# Patient Record
Sex: Female | Born: 1945 | Race: Black or African American | Hispanic: No | Marital: Single | Smoking: Former smoker
Health system: Southern US, Community
[De-identification: ages and names within clinical notes are randomized; demographics above are authoritative.]

## PROBLEM LIST (undated history)

## (undated) DIAGNOSIS — R569 Unspecified convulsions: Secondary | ICD-10-CM

## (undated) DIAGNOSIS — Z923 Personal history of irradiation: Secondary | ICD-10-CM

## (undated) DIAGNOSIS — C3490 Malignant neoplasm of unspecified part of unspecified bronchus or lung: Secondary | ICD-10-CM

## (undated) DIAGNOSIS — C719 Malignant neoplasm of brain, unspecified: Secondary | ICD-10-CM

## (undated) DIAGNOSIS — F17200 Nicotine dependence, unspecified, uncomplicated: Secondary | ICD-10-CM

## (undated) DIAGNOSIS — I10 Essential (primary) hypertension: Secondary | ICD-10-CM

## (undated) DIAGNOSIS — E785 Hyperlipidemia, unspecified: Secondary | ICD-10-CM

## (undated) HISTORY — DX: Nicotine dependence, unspecified, uncomplicated: F17.200

## (undated) HISTORY — DX: Hyperlipidemia, unspecified: E78.5

## (undated) HISTORY — DX: Essential (primary) hypertension: I10

---

## 1999-09-21 ENCOUNTER — Encounter: Admission: RE | Admit: 1999-09-21 | Discharge: 1999-09-21 | Payer: Self-pay | Admitting: *Deleted

## 1999-09-21 ENCOUNTER — Encounter: Payer: Self-pay | Admitting: *Deleted

## 1999-12-02 ENCOUNTER — Other Ambulatory Visit: Admission: RE | Admit: 1999-12-02 | Discharge: 1999-12-02 | Payer: Self-pay | Admitting: Obstetrics and Gynecology

## 2000-05-24 ENCOUNTER — Encounter: Payer: Self-pay | Admitting: *Deleted

## 2000-05-24 ENCOUNTER — Encounter: Admission: RE | Admit: 2000-05-24 | Discharge: 2000-05-24 | Payer: Self-pay | Admitting: *Deleted

## 2011-08-25 ENCOUNTER — Emergency Department (HOSPITAL_COMMUNITY)
Admission: EM | Admit: 2011-08-25 | Discharge: 2011-08-26 | Discharge status: Against Medical Advice | Payer: No Typology Code available for payment source | Attending: Emergency Medicine | Admitting: Emergency Medicine

## 2011-08-25 ENCOUNTER — Emergency Department (HOSPITAL_COMMUNITY): Payer: No Typology Code available for payment source

## 2011-08-25 DIAGNOSIS — M79609 Pain in unspecified limb: Secondary | ICD-10-CM | POA: Insufficient documentation

## 2011-08-26 ENCOUNTER — Emergency Department (HOSPITAL_COMMUNITY)
Admission: EM | Admit: 2011-08-26 | Discharge: 2011-08-26 | Disposition: A | Discharge status: Home / Self Care | Payer: No Typology Code available for payment source | Attending: Emergency Medicine | Admitting: Emergency Medicine

## 2011-08-26 DIAGNOSIS — M79609 Pain in unspecified limb: Secondary | ICD-10-CM | POA: Insufficient documentation

## 2011-08-26 DIAGNOSIS — I10 Essential (primary) hypertension: Secondary | ICD-10-CM | POA: Insufficient documentation

## 2011-08-26 DIAGNOSIS — S8010XA Contusion of unspecified lower leg, initial encounter: Secondary | ICD-10-CM | POA: Insufficient documentation

## 2013-12-14 ENCOUNTER — Encounter: Payer: Self-pay | Admitting: Family Medicine

## 2013-12-14 ENCOUNTER — Other Ambulatory Visit: Payer: Self-pay | Admitting: Family Medicine

## 2013-12-14 MED ORDER — BISOPROLOL-HYDROCHLOROTHIAZIDE 5-6.25 MG PO TABS: 1.0000 | ORAL_TABLET | Freq: Every day | ORAL | Status: DC

## 2013-12-14 NOTE — Telephone Encounter (Signed)
Rx Refilled and letter sent to schedule OV

## 2014-02-06 ENCOUNTER — Other Ambulatory Visit: Payer: Medicare HMO

## 2014-02-06 DIAGNOSIS — Z0000 Encounter for general adult medical examination without abnormal findings: Secondary | ICD-10-CM

## 2014-02-06 LAB — CBC WITH DIFFERENTIAL/PLATELET
Basophils Absolute: 0.1 10*3/uL (ref 0.0–0.1)
Basophils Relative: 1 % (ref 0–1)
Eosinophils Absolute: 0.1 10*3/uL (ref 0.0–0.7)
Eosinophils Relative: 1 % (ref 0–5)
HCT: 42.7 % (ref 36.0–46.0)
Hemoglobin: 14.5 g/dL (ref 12.0–15.0)
Lymphocytes Relative: 34 % (ref 12–46)
Lymphs Abs: 2.6 10*3/uL (ref 0.7–4.0)
MCH: 30.9 pg (ref 26.0–34.0)
MCHC: 34 g/dL (ref 30.0–36.0)
MCV: 91 fL (ref 78.0–100.0)
Monocytes Absolute: 0.7 10*3/uL (ref 0.1–1.0)
Monocytes Relative: 9 % (ref 3–12)
Neutro Abs: 4.1 10*3/uL (ref 1.7–7.7)
Neutrophils Relative %: 55 % (ref 43–77)
Platelets: 308 10*3/uL (ref 150–400)
RBC: 4.69 MIL/uL (ref 3.87–5.11)
RDW: 14.3 % (ref 11.5–15.5)
WBC: 7.5 10*3/uL (ref 4.0–10.5)

## 2014-02-06 LAB — LIPID PANEL
Cholesterol: 241 mg/dL — ABNORMAL HIGH (ref 0–200)
HDL: 48 mg/dL (ref >39)
LDL Cholesterol: 171 mg/dL — ABNORMAL HIGH (ref 0–99)
Total CHOL/HDL Ratio: 5 Ratio
Triglycerides: 109 mg/dL (ref <150)
VLDL: 22 mg/dL (ref 0–40)

## 2014-02-06 LAB — COMPLETE METABOLIC PANEL WITH GFR
ALT: 14 U/L (ref 0–35)
AST: 12 U/L (ref 0–37)
Albumin: 4 g/dL (ref 3.5–5.2)
Alkaline Phosphatase: 62 U/L (ref 39–117)
BUN: 14 mg/dL (ref 6–23)
CO2: 25 mEq/L (ref 19–32)
Calcium: 9.2 mg/dL (ref 8.4–10.5)
Chloride: 101 mEq/L (ref 96–112)
Creat: 0.79 mg/dL (ref 0.50–1.10)
GFR, Est African American: >89 mL/min
GFR, Est Non African American: 79 mL/min
Glucose, Bld: 94 mg/dL (ref 70–99)
Potassium: 4.8 mEq/L (ref 3.5–5.3)
Sodium: 136 mEq/L (ref 135–145)
Total Bilirubin: 1 mg/dL (ref 0.2–1.2)
Total Protein: 6.6 g/dL (ref 6.0–8.3)

## 2014-02-12 ENCOUNTER — Encounter: Payer: Self-pay | Admitting: Family Medicine

## 2014-02-12 ENCOUNTER — Other Ambulatory Visit: Payer: Self-pay | Admitting: Family Medicine

## 2014-02-12 ENCOUNTER — Ambulatory Visit (INDEPENDENT_AMBULATORY_CARE_PROVIDER_SITE_OTHER): Payer: Medicare HMO | Admitting: Family Medicine

## 2014-02-12 VITALS — BP 136/80 | HR 78 | Temp 97.3°F | Resp 16 | Ht 63.0 in | Wt 178.0 lb

## 2014-02-12 DIAGNOSIS — F17200 Nicotine dependence, unspecified, uncomplicated: Secondary | ICD-10-CM | POA: Insufficient documentation

## 2014-02-12 DIAGNOSIS — Z0000 Encounter for general adult medical examination without abnormal findings: Secondary | ICD-10-CM

## 2014-02-12 NOTE — Progress Notes (Signed)
Subjective:    Patient ID: Kristina Lawson, female    DOB: 10/13/1946, 68 y.o.   MRN: [id number]  HPI Patient is a very pleasant 68 year old African American female who continues to smoke, she has high blood pressure, and she has uncontrolled hyperlipidemia. She refuses to take any statin medication. She is interested in strategies to help with smoking cessation. She is overdue for mammogram. She is overdue for colonoscopy. She is due for Pneumovax 23 as well as Prevnar 13. She refuses on Monday vaccines. She refuses a colonoscopy. Past Medical History  Diagnosis Date  . Hypertension   . Smoker   . Hyperlipidemia    Current Outpatient Prescriptions on File Prior to Visit  Medication Sig Dispense Refill  . bisoprolol-hydrochlorothiazide (ZIAC) 5-6.25 MG per tablet Take 1 tablet by mouth daily.  30 tablet  1   No current facility-administered medications on file prior to visit.   No Known Allergies History   Social History  . Marital Status: Single    Spouse Name: N/A    Number of Children: N/A  . Years of Education: N/A   Occupational History  . Not on file.   Social History Main Topics  . Smoking status: Current Every Day Smoker  . Smokeless tobacco: Not on file  . Alcohol Use: Yes     Comment: Occasional Wine  . Drug Use: No  . Sexual Activity: Not on file   Other Topics Concern  . Not on file   Social History Narrative  . No narrative on file   Family History  Problem Relation Age of Onset  . Heart disease Mother       Review of Systems  All other systems reviewed and are negative.       Objective:   Physical Exam  Vitals reviewed. Constitutional: She is oriented to person, place, and time. She appears well-developed and well-nourished. No distress.  HENT:  Head: Normocephalic and atraumatic.  Right Ear: External ear normal.  Left Ear: External ear normal.  Nose: Nose normal.  Mouth/Throat: Oropharynx is clear and moist. No oropharyngeal exudate.    Eyes: Conjunctivae and EOM are normal. Pupils are equal, round, and reactive to light. Right eye exhibits no discharge. Left eye exhibits no discharge. No scleral icterus.  Neck: Normal range of motion. Neck supple. No JVD present. No tracheal deviation present. No thyromegaly present.  Cardiovascular: Normal rate, regular rhythm, normal heart sounds and intact distal pulses.  Exam reveals no gallop and no friction rub.   No murmur heard. Pulmonary/Chest: Effort normal and breath sounds normal. No stridor. No respiratory distress. She has no wheezes. She has no rales. She exhibits no tenderness.  Abdominal: Soft. Bowel sounds are normal. She exhibits no distension and no mass. There is no tenderness. There is no rebound and no guarding.  Genitourinary: Vagina normal and uterus normal. No vaginal discharge found.  Musculoskeletal: Normal range of motion. She exhibits no edema and no tenderness.  Lymphadenopathy:    She has no cervical adenopathy.  Neurological: She is alert and oriented to person, place, and time. She has normal reflexes. She displays normal reflexes. No cranial nerve deficit. She exhibits normal muscle tone. Coordination normal.  Skin: Skin is warm. No rash noted. She is not diaphoretic. No erythema. No pallor.  Psychiatric: She has a normal mood and affect. Her behavior is normal. Judgment and thought content normal.   patient has a mild bladder prolapse.        Assessment &  Plan:  1. Routine general medical examination at a health care facility Pap smear was performed today in the office. Her blood pressure is well controlled. I recommended Lipitor for her hyperlipidemia but she declined. I recommended a colonoscopy but she declined it. We will perform fecal occult blood cards instead. I will schedule the patient for a mammogram. Also the patient Prevnar and Pneumovax but she declined. - PAP, ThinPrep, Imaging, Medicare - Fecal occult blood, imunochemical - Fecal occult  blood, imunochemical - Fecal occult blood, imunochemical - MM Digital Diagnostic Bilat; Future

## 2014-02-13 ENCOUNTER — Encounter: Payer: Self-pay | Admitting: *Deleted

## 2014-02-13 ENCOUNTER — Telehealth: Payer: Self-pay | Admitting: Family Medicine

## 2014-02-13 MED ORDER — BISOPROLOL-HYDROCHLOROTHIAZIDE 5-6.25 MG PO TABS: 1.0000 | ORAL_TABLET | Freq: Every day | ORAL | Status: DC

## 2014-02-13 NOTE — Telephone Encounter (Signed)
Call back number is [phone] Pt is calling because she is needing a refill on bisoprolol-hydrochlorothiazide (ZIAC) 5-6.25 MG per tablet She forgot to tell you yesterday

## 2014-02-13 NOTE — Telephone Encounter (Signed)
Rx Refilled  

## 2014-02-14 LAB — PAP, THIN PREP, IMAGING, MEDICARE

## 2014-02-15 ENCOUNTER — Other Ambulatory Visit: Payer: Self-pay | Admitting: Family Medicine

## 2014-02-15 DIAGNOSIS — Z1231 Encounter for screening mammogram for malignant neoplasm of breast: Secondary | ICD-10-CM

## 2014-02-19 ENCOUNTER — Other Ambulatory Visit: Payer: Self-pay | Admitting: Family Medicine

## 2014-02-19 DIAGNOSIS — E559 Vitamin D deficiency, unspecified: Secondary | ICD-10-CM

## 2014-02-19 DIAGNOSIS — Z1231 Encounter for screening mammogram for malignant neoplasm of breast: Secondary | ICD-10-CM

## 2014-02-19 LAB — HPV DNA, RELFEX TYPE 16/18: HPV DNA High Risk: NOT DETECTED

## 2014-06-17 ENCOUNTER — Emergency Department (HOSPITAL_COMMUNITY): Payer: Medicare HMO

## 2014-06-17 ENCOUNTER — Inpatient Hospital Stay (HOSPITAL_COMMUNITY)
Admission: EM | Admit: 2014-06-17 | Discharge: 2014-06-19 | DRG: 054 | Disposition: A | Discharge status: Home / Self Care | Payer: Medicare HMO | Attending: Internal Medicine | Admitting: Internal Medicine

## 2014-06-17 ENCOUNTER — Encounter (HOSPITAL_COMMUNITY): Payer: Self-pay | Admitting: Radiology

## 2014-06-17 DIAGNOSIS — R569 Unspecified convulsions: Secondary | ICD-10-CM

## 2014-06-17 DIAGNOSIS — R918 Other nonspecific abnormal finding of lung field: Secondary | ICD-10-CM

## 2014-06-17 DIAGNOSIS — E785 Hyperlipidemia, unspecified: Secondary | ICD-10-CM | POA: Diagnosis present

## 2014-06-17 DIAGNOSIS — E279 Disorder of adrenal gland, unspecified: Secondary | ICD-10-CM | POA: Diagnosis present

## 2014-06-17 DIAGNOSIS — F17200 Nicotine dependence, unspecified, uncomplicated: Secondary | ICD-10-CM | POA: Diagnosis present

## 2014-06-17 DIAGNOSIS — Z8249 Family history of ischemic heart disease and other diseases of the circulatory system: Secondary | ICD-10-CM

## 2014-06-17 DIAGNOSIS — C7931 Secondary malignant neoplasm of brain: Principal | ICD-10-CM | POA: Diagnosis present

## 2014-06-17 DIAGNOSIS — I10 Essential (primary) hypertension: Secondary | ICD-10-CM | POA: Diagnosis present

## 2014-06-17 DIAGNOSIS — G936 Cerebral edema: Secondary | ICD-10-CM | POA: Diagnosis present

## 2014-06-17 DIAGNOSIS — C7949 Secondary malignant neoplasm of other parts of nervous system: Principal | ICD-10-CM

## 2014-06-17 DIAGNOSIS — Z803 Family history of malignant neoplasm of breast: Secondary | ICD-10-CM

## 2014-06-17 DIAGNOSIS — E871 Hypo-osmolality and hyponatremia: Secondary | ICD-10-CM | POA: Diagnosis present

## 2014-06-17 DIAGNOSIS — IMO0002 Reserved for concepts with insufficient information to code with codable children: Secondary | ICD-10-CM | POA: Diagnosis not present

## 2014-06-17 DIAGNOSIS — R222 Localized swelling, mass and lump, trunk: Secondary | ICD-10-CM | POA: Diagnosis present

## 2014-06-17 DIAGNOSIS — C719 Malignant neoplasm of brain, unspecified: Secondary | ICD-10-CM

## 2014-06-17 HISTORY — DX: Unspecified convulsions: R56.9

## 2014-06-17 HISTORY — DX: Malignant neoplasm of brain, unspecified: C71.9

## 2014-06-17 LAB — CBC
HEMATOCRIT: 42.3 % (ref 36.0–46.0)
HEMOGLOBIN: 14.2 g/dL (ref 12.0–15.0)
MCH: 30.3 pg (ref 26.0–34.0)
MCHC: 33.6 g/dL (ref 30.0–36.0)
MCV: 90.4 fL (ref 78.0–100.0)
Platelets: 299 10*3/uL (ref 150–400)
RBC: 4.68 MIL/uL (ref 3.87–5.11)
RDW: 13.5 % (ref 11.5–15.5)
WBC: 11.1 10*3/uL — AB (ref 4.0–10.5)

## 2014-06-17 LAB — BASIC METABOLIC PANEL
Anion gap: 17 — ABNORMAL HIGH (ref 5–15)
BUN: 9 mg/dL (ref 6–23)
CHLORIDE: 86 meq/L — AB (ref 96–112)
CO2: 23 meq/L (ref 19–32)
Calcium: 9.5 mg/dL (ref 8.4–10.5)
Creatinine, Ser: 0.74 mg/dL (ref 0.50–1.10)
GFR calc Af Amer: >90 mL/min (ref >90)
GFR calc non Af Amer: 87 mL/min — ABNORMAL LOW (ref >90)
Glucose, Bld: 145 mg/dL — ABNORMAL HIGH (ref 70–99)
Potassium: 4.3 mEq/L (ref 3.7–5.3)
SODIUM: 126 meq/L — AB (ref 137–147)

## 2014-06-17 LAB — CBG MONITORING, ED: Glucose-Capillary: 133 mg/dL — ABNORMAL HIGH (ref 70–99)

## 2014-06-17 MED ORDER — DEXAMETHASONE SODIUM PHOSPHATE 10 MG/ML IJ SOLN
10.0000 mg | Freq: Once | INTRAMUSCULAR | Status: AC
  Administered 2014-06-17: 10 mg via INTRAVENOUS
  Filled 2014-06-17: qty 1

## 2014-06-17 MED ORDER — GADOBENATE DIMEGLUMINE 529 MG/ML IV SOLN: 17.0000 mL | Freq: Once | INTRAVENOUS | Status: AC | PRN

## 2014-06-17 MED ORDER — GADOBENATE DIMEGLUMINE 529 MG/ML IV SOLN
17.0000 mL | Freq: Once | INTRAVENOUS | Status: AC | PRN
  Administered 2014-06-17: 17 mL via INTRAVENOUS

## 2014-06-17 MED ORDER — SODIUM CHLORIDE 0.9 % IV BOLUS (SEPSIS)
1000.0000 mL | Freq: Once | INTRAVENOUS | Status: AC
  Administered 2014-06-17: 1000 mL via INTRAVENOUS

## 2014-06-17 MED ORDER — LEVETIRACETAM IN NACL 1000 MG/100ML IV SOLN
1000.0000 mg | Freq: Once | INTRAVENOUS | Status: AC
  Administered 2014-06-17: 1000 mg via INTRAVENOUS
  Filled 2014-06-17: qty 100

## 2014-06-17 NOTE — ED Provider Notes (Signed)
CSN: [id number]     Arrival date & time 06/17/14  1824 History   First MD Initiated Contact with Patient 06/17/14 1854     Chief Complaint  Patient presents with  . Seizures     (Consider location/radiation/quality/duration/timing/severity/associated sxs/prior Treatment) HPI Pt presents after report of seizure activity.  She was sitting in a chair and had onset of stuttering speech, tonic/clonic movements of her right arm.  She was not answering questions.  No syncope or loss of consciosness.  To me patient denies remembering what occurred.  Per EMS she was groggy upon their arrival but quickly returned to her baseline.  No incontinence of urine.  No recent illness. No headaches.  She mentions having right shoulder pain for some time, but no weakness of arms.  No prior hx  Past Medical History  Diagnosis Date  . Hypertension   . Smoker   . Hyperlipidemia    History reviewed. No pertinent past surgical history. Family History  Problem Relation Age of Onset  . Heart disease Mother    History  Substance Use Topics  . Smoking status: Current Every Day Smoker  . Smokeless tobacco: Not on file  . Alcohol Use: Yes     Comment: Occasional Wine   OB History   Grav Para Term Preterm Abortions TAB SAB Ect Mult Living                 Review of Systems ROS reviewed and all otherwise negative except for mentioned in HPI    Allergies  Review of patient's allergies indicates no active allergies.  Home Medications   Prior to Admission medications   Medication Sig Start Date End Date Taking? Authorizing Provider  bisoprolol-hydrochlorothiazide (ZIAC) 5-6.25 MG per tablet Take 1 tablet by mouth daily.   Yes Historical Provider, MD  Cholecalciferol (VITAMIN D) 2000 UNITS tablet Take 2,000 Units by mouth daily.   Yes Historical Provider, MD  Omega-3 Fatty Acids (FISH OIL) 1200 MG CAPS Take 1,200 mg by mouth daily.    Yes Historical Provider, MD  penicillin v potassium (VEETID) 500 MG  tablet Take 500 mg by mouth 4 (four) times daily.   Yes Historical Provider, MD  bisoprolol (ZEBETA) 5 MG tablet Take 1 tablet (5 mg total) by mouth daily. 06/19/14   Domenic Polite, MD  dexamethasone (DECADRON) 4 MG tablet Take 1 tablet (4 mg total) by mouth 2 (two) times daily with a meal. 06/19/14   Domenic Polite, MD  levETIRAcetam (KEPPRA) 500 MG tablet Take 1 tablet (500 mg total) by mouth 2 (two) times daily. 06/19/14   Domenic Polite, MD   BP 151/78  Pulse 83  Temp(Src) 98.1 F (36.7 C) (Oral)  Resp 14  Ht 5\' 3"  (1.6 m)  Wt 173 lb 8 oz (78.7 kg)  BMI 30.74 kg/m2  SpO2 100% Vitals reviewed Physical Exam Physical Examination: General appearance - alert, well appearing, and in no distress Mental status - alert, oriented to person, place, and time Eyes - pupils equal and reactive, extraocular movements intact Mouth - mucous membranes moist, pharynx normal without lesions Neck - supple, no significant adenopathy Chest - clear to auscultation, no wheezes, rales or rhonchi, symmetric air entry Heart - normal rate, regular rhythm, normal S1, S2, no murmurs, rubs, clicks or gallops Abdomen - soft, nontender, nondistended, no masses or organomegaly Neurological - alert, oriented x 3, cranial nerves 2-12 tested and intact, strength 5/5 in extremities x 4, sensation intact Extremities - peripheral pulses normal, no  pedal edema, no clubbing or cyanosis Skin - normal coloration and turgor, no rashes  ED Course  Procedures (including critical care time)  10:41 PM neurology has seen patient, MR shows brain mets- he has written for decadron, keppra.  I have discussed with dr. Hal Hope for admission to step down bed.   Labs Review Labs Reviewed  BASIC METABOLIC PANEL - Abnormal; Notable for the following:    Sodium 126 (*)    Chloride 86 (*)    Glucose, Bld 145 (*)    GFR calc non Af Amer 87 (*)    Anion gap 17 (*)    All other components within normal limits  CBC - Abnormal; Notable  for the following:    WBC 11.1 (*)    All other components within normal limits  COMPREHENSIVE METABOLIC PANEL - Abnormal; Notable for the following:    Sodium 131 (*)    Chloride 95 (*)    Glucose, Bld 151 (*)    All other components within normal limits  CBC WITH DIFFERENTIAL - Abnormal; Notable for the following:    Neutrophils Relative % 91 (*)    Lymphocytes Relative 7 (*)    Lymphs Abs 0.6 (*)    Monocytes Relative 2 (*)    All other components within normal limits  BASIC METABOLIC PANEL - Abnormal; Notable for the following:    Sodium 136 (*)    Glucose, Bld 112 (*)    GFR calc non Af Amer 88 (*)    All other components within normal limits  CBG MONITORING, ED - Abnormal; Notable for the following:    Glucose-Capillary 133 (*)    All other components within normal limits  MRSA PCR SCREENING  TSH  PROTIME-INR  APTT  CBC  CBC  SURGICAL PATHOLOGY    Imaging Review Dg Chest 2 View  06/17/2014   CLINICAL DATA:  Seizure.  EXAM: CHEST  2 VIEW  COMPARISON:  None  FINDINGS: The lungs are well-aerated. Focal right basilar airspace opacity is compatible with pneumonia, with additional right perihilar opacity seen. Mild vascular congestion is noted. No pleural effusion or pneumothorax is identified.  The heart is mildly enlarged. No acute osseous abnormalities are seen.  IMPRESSION: 1. Focal right basilar and right perihilar airspace opacity is compatible with pneumonia. Would treat for pneumonia, then perform follow-up chest radiograph to ensure resolution of airspace opacity. If the patient has no symptoms for pneumonia, would correlate to exclude aspiration. Mass cannot be excluded, but is considered less likely given the appearance of the opacity. 2. Mild vascular congestion and mild cardiomegaly noted.   Electronically Signed   By: Garald Balding M.D.   On: 06/17/2014 22:13   Ct Head Wo Contrast   (if New Onset Seizure And/or Head Trauma)  06/17/2014   CLINICAL DATA:  Seizures   EXAM: CT HEAD WITHOUT CONTRAST  TECHNIQUE: Contiguous axial images were obtained from the base of the skull through the vertex without intravenous contrast.  COMPARISON:  None.  FINDINGS: Abnormal white matter hypodensity in the right occipital lobe and right posterior parietal lobe is asymmetric. Although the possibility of some cortical involvement is raised, this primarily appears to involve the white matter and is accordingly suspicious for vasogenic edema. Right upper parietal gyri appear asymmetric on image 25 of series 2.  The brainstem, cerebellum, basal ganglia, ventricular system, and basilar cisterns appear otherwise unremarkable. There is atherosclerotic calcification of the cavernous carotid arteries bilaterally.  IMPRESSION: 1. Asymmetric abnormal white matter  hypodensity in the right occipital lobe and right parietal lobe posteriorly. This primarily resembles vasogenic edema and raises the possibility of underlying tumor, encephalitis, demyelinating process, or less likely an unusual ischemic process. Posterior reversible encephalopathy is considered less likely given the unilaterality. MRI brain with and without contrast is recommended. Also consider obtaining a chest radiograph.  These results were called by telephone at the time of interpretation on 06/17/2014 at 8:12 pm to Dr. Lajean Saver, who verbally acknowledged these results.   Electronically Signed   By: Sherryl Barters M.D.   On: 06/17/2014 20:13   Ct Chest W Contrast  06/18/2014   CLINICAL DATA:  Known intracranial metastatic disease  EXAM: CT CHEST, ABDOMEN, AND PELVIS WITH CONTRAST  TECHNIQUE: Multidetector CT imaging of the chest, abdomen and pelvis was performed following the standard protocol during bolus administration of intravenous contrast.  CONTRAST:  131mL OMNIPAQUE IOHEXOL 300 MG/ML  SOLN  COMPARISON:  Chest x-ray from the previous day  FINDINGS: CT CHEST FINDINGS  The left lung is well aerated. A 9 mm nodule is noted within  the left lateral costophrenic angle. The right lung is well aerated but demonstrates evidence of a the lobular mass lesion which measures at least 2.6 x 2.3 cm within the right upper lobe posteriorly. Some associated atelectatic changes are noted. Additionally there is significant sub carinal and right hilar and right paratracheal lymphadenopathy. The right paratracheal adenopathy measures 2.8 x 2.3 cm. The subcarinal and right hilar adenopathy is confluent and measures at least 7.0 by 5.6 cm. It encases the superior vena cava and right pulmonary artery and causes some stenosis but no obstruction is seen. No thrombus is noted. Coronary calcifications are seen.  CT ABDOMEN AND PELVIS FINDINGS  The liver, gallbladder, spleen and right adrenal gland are within normal limits. The pancreas is unremarkable. The left adrenal gland demonstrates a 3.4 x 3.2 cm mass lesion consistent with metastatic disease. Kidneys are well visualized and demonstrate evidence of portion to deformity with an apparent central fibrous band. No renal calculi or obstructive changes are noted. Renal vascular calcifications are seen. Diffuse aortic calcifications are noted without aneurysmal dilatation.  The appendix is within normal limits. Mild diverticular change is noted without evidence of diverticulitis. The bladder is well distended. The uterus show some scattered calcifications likely related uterine fibroid disease. No definitive bony metastatic disease is noted.  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 Vascular encasement without occlusion or thrombosis is noted.  These results will be called to the ordering clinician or representative by the Radiologist Assistant, and communication documented in the PACS or zVision Dashboard.   Electronically  Signed   By: Inez Catalina M.D.   On: 06/18/2014 13:54   Mr Jeri Cos LF Contrast  06/17/2014   CLINICAL DATA:  Seizure.  Abnormal head CT.  EXAM: MRI HEAD WITHOUT AND WITH CONTRAST  TECHNIQUE: Multiplanar, multiecho pulse sequences of the brain and surrounding structures were obtained without and with intravenous contrast.  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  There is a 14 mm avidly enhancing lesion in the right parieto-occipital region without central necrosis. There is pronounced regional vasogenic edema. There is  a second lesion within the cerebellum on the right measuring 4 mm in diameter. There is a third lesion within the inferior cerebellum on the left measuring 5 mm in diameter. There is a 4 mm lesion in the right frontal region. There is a questionable fifth lesion deep to the insula on the left measuring about 4 mm.  No evidence of hydrocephalus.  No extra-axial fluid collection.  There is an extensive lesion centered within the sphenoid bone on the left with enhancing mass extending along the lateral margin and floor of the middle cranial fossa and into the masticator space. This is presumed to represent an additional site of metastatic disease. Theoretically, this could be a primary lesion from which the brain metastases arose.  IMPRESSION: 4 and possibly 5 brain masses consistent with metastatic disease. The largest lesion was visible at CT. This represents a 14 mm mass in the right parietal occipital brain with surrounding edema. There are 2 lesions an the cerebellum and a few other scattered within the cerebral hemispheres.  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 This could represent metastatic disease or  could be a primary lesion of some sort.   Electronically Signed   By: Nelson Chimes M.D.   On: 06/17/2014 21:47   Ct Abdomen Pelvis W Contrast  06/18/2014   CLINICAL DATA:  Known intracranial metastatic disease  EXAM: CT CHEST, ABDOMEN, AND PELVIS WITH CONTRAST  TECHNIQUE: Multidetector CT imaging of the chest, abdomen and pelvis was performed following the standard protocol during bolus administration of intravenous contrast.  CONTRAST:  170mL OMNIPAQUE IOHEXOL 300 MG/ML  SOLN  COMPARISON:  Chest x-ray from the previous day  FINDINGS: CT CHEST FINDINGS  The left lung is well aerated. A 9 mm nodule is noted within the left lateral costophrenic angle. The right lung is well aerated but demonstrates evidence of a the lobular mass lesion which measures at least 2.6 x 2.3 cm within the right upper lobe posteriorly. Some associated atelectatic changes are noted. Additionally there is significant sub carinal and right hilar and right paratracheal lymphadenopathy. The right paratracheal adenopathy measures 2.8 x 2.3 cm. The subcarinal and right hilar adenopathy is confluent and measures at least 7.0 by 5.6 cm. It encases the superior vena cava and right pulmonary artery and causes some stenosis but no obstruction is seen. No thrombus is noted. Coronary calcifications are seen.  CT ABDOMEN AND PELVIS FINDINGS  The liver, gallbladder, spleen and right adrenal gland are within normal limits. The pancreas is unremarkable. The left adrenal gland demonstrates a 3.4 x 3.2 cm mass lesion consistent with metastatic disease. Kidneys are well visualized and demonstrate evidence of portion to deformity with an apparent central fibrous band. No renal calculi or obstructive changes are noted. Renal vascular calcifications are seen. Diffuse aortic calcifications are noted without aneurysmal dilatation.  The appendix is within normal limits. Mild diverticular change is noted without evidence of diverticulitis. The bladder is well  distended. The uterus show some scattered calcifications likely related uterine fibroid disease. No definitive bony metastatic disease is noted.  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 Vascular encasement without occlusion or thrombosis is noted.  These results  will be called to the ordering clinician or representative by the Radiologist Assistant, and communication documented in the PACS or zVision Dashboard.   Electronically Signed   By: Inez Catalina M.D.   On: 06/18/2014 13:54   Dg Humerus Right  06/18/2014   CLINICAL DATA:  Right arm pain without history of trauma  EXAM: RIGHT HUMERUS - 2+ VIEW  COMPARISON:  None.  FINDINGS: The humerus is adequately mineralized. No acute fracture or dislocation is demonstrated. Portions of the upper aspect of the shaft are obscured 2 of the images. The observed portions of the glenohumeral joint and of the elbow exhibit no acute abnormalities. The soft tissues of the arm are unremarkable.  IMPRESSION: There is no acute bony abnormality of the right humerus.   Electronically Signed   By: David  Martinique   On: 06/18/2014 18:20     EKG Interpretation   Date/Time:  Monday June 17 2014 18:36:41 EDT Ventricular Rate:  106 PR Interval:  181 QRS Duration: 77 QT Interval:  339 QTC Calculation: 450 R Axis:   24 Text Interpretation:  Sinus tachycardia Ventricular premature complex  Probable left atrial enlargement Low voltage, precordial leads Borderline  repolarization abnormality No old tracing to compare Confirmed by [facility]   MD, Zayyan Mullen [phone]) on 06/17/2014 10:12:39 PM      MDM   Final diagnoses:  Generalized seizure  Brain metastasis  Smoker  Brain metastases  Essential hypertension  Seizures  Lung mass    Pt presenting after new onset seizure- found to  have vasogenic edema on CT scan.  Neurology consulted, MRI shows multiple mets of brain.  CXR with findings concerning for lung mass versus pneumonia- more likely mass- no signs or symptoms of pneumonia.  Neurology recommended decadron and keppra.  Pt has normal neuro exam in the ED.  Pt admitted to [facility] for further workup and management.      Threasa Beards, MD 06/19/14 [phone]

## 2014-06-17 NOTE — Consult Note (Signed)
Reason for Consult: Seizure with abnormal brain scan.  HPI:                                                                                                                                          Kristina Lawson is an 68 y.o. female 68 year old lady with history of hypertension and hyperlipidemia as well as tobacco abuse, brought to the emergency room following a witnessed generalized seizure. Patient has no previous history of seizure activity. CT scan of her head showed an area of hypodensity in the right parieto-occipital region indicative of vasogenic edema. MRI of the brain showed 4 and possibly 5 brain masses consistent with metastatic disease. The largest lesion was in the right parieto-occipital region measuring 14 mm with surrounding edema. 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 Patient's only complaint is feeling of swelling involving left side of her face and right shoulder pain.  Past Medical History  Diagnosis Date  . Hypertension   . Smoker   . Hyperlipidemia     No past surgical history on file.  Family History  Problem Relation Age of Onset  . Heart disease Mother     Social History:  reports that she has been smoking.  She does not have any smokeless tobacco history on file. She reports that she drinks alcohol. She reports that she does not use illicit drugs.  No Active Allergies  MEDICATIONS:                                                                                                                     I have reviewed the patient's current medications.   ROS:  History obtained from the patient  General ROS: negative for - chills, fatigue, fever, night sweats,  weight gain or weight loss Psychological ROS: negative for - behavioral disorder, hallucinations, memory difficulties, mood swings or suicidal ideation Ophthalmic ROS: negative for - blurry vision, double vision, eye pain or loss of vision ENT ROS: negative for - epistaxis, nasal discharge, oral lesions, sore throat, tinnitus or vertigo Allergy and Immunology ROS: negative for - hives or itchy/watery eyes Hematological and Lymphatic ROS: negative for - bleeding problems, bruising or swollen lymph nodes Endocrine ROS: negative for - galactorrhea, hair pattern changes, polydipsia/polyuria or temperature intolerance Respiratory ROS: negative for - cough, hemoptysis, shortness of breath or wheezing Cardiovascular ROS: negative for - chest pain, dyspnea on exertion, edema or irregular heartbeat Gastrointestinal ROS: negative for - abdominal pain, diarrhea, hematemesis, nausea/vomiting or stool incontinence Genito-Urinary ROS: negative for - dysuria, hematuria, incontinence or urinary frequency/urgency Musculoskeletal ROS: Positive for right shoulder pain over the past one week Neurological ROS: as noted in HPI Dermatological ROS: negative for rash and skin lesion changes   Blood pressure 193/80, pulse 86, temperature 97.4 F (36.3 C), temperature source Oral, resp. rate 17, SpO2 92.00%.   Neurologic Examination:                                                                                                      Mental Status: Alert, oriented, thought content appropriate.  Speech fluent without evidence of aphasia. Able to follow commands without difficulty. Cranial Nerves: II-left visual field defect with inconsistency in ability to count fingers. III/IV/VI-Pupils were equal and reacted. Extraocular movements were full and conjugate.    V/VII-no facial numbness and no facial weakness. VIII-normal. X-normal speech and symmetrical palatal movement. Motor: 5/5 bilaterally with normal tone and  bulk Sensory: Normal throughout. Deep Tendon Reflexes: 1+ and symmetric in upper extremities and absent at knees and ankles. Plantars: Flexor bilaterally Cerebellar: Normal finger-to-nose testing was normal with use of left upper extremity; could not be tested on the right due to limited range of motion of right shoulder secondary to pain.  Lab Results  Component Value Date/Time   CHOL 241* 02/06/2014  9:08 AM    Results for orders placed during the hospital encounter of 06/17/14 (from the past 48 hour(s))  BASIC METABOLIC PANEL     Status: Abnormal   Collection Time    06/17/14  6:41 PM      Result Value Ref Range   Sodium 126 (*) 137 - 147 mEq/L   Potassium 4.3  3.7 - 5.3 mEq/L   Chloride 86 (*) 96 - 112 mEq/L   CO2 23  19 - 32 mEq/L   Glucose, Bld 145 (*) 70 - 99 mg/dL   BUN 9  6 - 23 mg/dL   Creatinine, Ser 0.74  0.50 - 1.10 mg/dL   Calcium 9.5  8.4 - 10.5 mg/dL   GFR calc non Af Amer 87 (*) >90 mL/min   GFR calc Af Amer >90  >90 mL/min   Comment: (NOTE)     The eGFR has been calculated  using the CKD EPI equation.     This calculation has not been validated in all clinical situations.     eGFR's persistently <90 mL/min signify possible Chronic Kidney     Disease.   Anion gap 17 (*) 5 - 15  CBC     Status: Abnormal   Collection Time    06/17/14  6:41 PM      Result Value Ref Range   WBC 11.1 (*) 4.0 - 10.5 K/uL   RBC 4.68  3.87 - 5.11 MIL/uL   Hemoglobin 14.2  12.0 - 15.0 g/dL   HCT 42.3  36.0 - 46.0 %   MCV 90.4  78.0 - 100.0 fL   MCH 30.3  26.0 - 34.0 pg   MCHC 33.6  30.0 - 36.0 g/dL   RDW 13.5  11.5 - 15.5 %   Platelets 299  150 - 400 K/uL  CBG MONITORING, ED     Status: Abnormal   Collection Time    06/17/14  7:10 PM      Result Value Ref Range   Glucose-Capillary 133 (*) 70 - 99 mg/dL   Comment 1 Notify RN      Dg Chest 2 View  06/17/2014   CLINICAL DATA:  Seizure.  EXAM: CHEST  2 VIEW  COMPARISON:  None  FINDINGS: The lungs are well-aerated. Focal right  basilar airspace opacity is compatible with pneumonia, with additional right perihilar opacity seen. Mild vascular congestion is noted. No pleural effusion or pneumothorax is identified.  The heart is mildly enlarged. No acute osseous abnormalities are seen.  IMPRESSION: 1. Focal right basilar and right perihilar airspace opacity is compatible with pneumonia. Would treat for pneumonia, then perform follow-up chest radiograph to ensure resolution of airspace opacity. If the patient has no symptoms for pneumonia, would correlate to exclude aspiration. Mass cannot be excluded, but is considered less likely given the appearance of the opacity. 2. Mild vascular congestion and mild cardiomegaly noted.   Electronically Signed   By: Garald Balding M.D.   On: 06/17/2014 22:13   Ct Head Wo Contrast   (if New Onset Seizure And/or Head Trauma)  06/17/2014   CLINICAL DATA:  Seizures  EXAM: CT HEAD WITHOUT CONTRAST  TECHNIQUE: Contiguous axial images were obtained from the base of the skull through the vertex without intravenous contrast.  COMPARISON:  None.  FINDINGS: Abnormal white matter hypodensity in the right occipital lobe and right posterior parietal lobe is asymmetric. Although the possibility of some cortical involvement is raised, this primarily appears to involve the white matter and is accordingly suspicious for vasogenic edema. Right upper parietal gyri appear asymmetric on image 25 of series 2.  The brainstem, cerebellum, basal ganglia, ventricular system, and basilar cisterns appear otherwise unremarkable. There is atherosclerotic calcification of the cavernous carotid arteries bilaterally.  IMPRESSION: 1. Asymmetric abnormal white matter hypodensity in the right occipital lobe and right parietal lobe posteriorly. This primarily resembles vasogenic edema and raises the possibility of underlying tumor, encephalitis, demyelinating process, or less likely an unusual ischemic process. Posterior reversible  encephalopathy is considered less likely given the unilaterality. MRI brain with and without contrast is recommended. Also consider obtaining a chest radiograph.  These results were called by telephone at the time of interpretation on 06/17/2014 at 8:12 pm to Dr. Lajean Saver, who verbally acknowledged these results.   Electronically Signed   By: Sherryl Barters M.D.   On: 06/17/2014 20:13   Mr Jeri Cos EZ Contrast  06/17/2014  CLINICAL DATA:  Seizure.  Abnormal head CT.  EXAM: MRI HEAD WITHOUT AND WITH CONTRAST  TECHNIQUE: Multiplanar, multiecho pulse sequences of the brain and surrounding structures were obtained without and with intravenous contrast.  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  There is a 14 mm avidly enhancing lesion in the right parieto-occipital region without central necrosis. There is pronounced regional vasogenic edema. There is a second lesion within the cerebellum on the right measuring 4 mm in diameter. There is a third lesion within the inferior cerebellum on the left measuring 5 mm in diameter. There is a 4 mm lesion in the right frontal region. There is a questionable fifth lesion deep to the insula on the left measuring about 4 mm.  No evidence of hydrocephalus.  No extra-axial fluid collection.  There is an extensive lesion centered within the sphenoid bone on the left with enhancing mass extending along the lateral margin and floor of the middle cranial fossa and into the masticator space. This is presumed to represent an additional site of metastatic disease. Theoretically, this could be a primary lesion from which the brain metastases arose.  IMPRESSION: 4 and possibly 5 brain masses consistent with metastatic disease. The largest lesion was visible at CT. This represents a 14 mm mass in the right parietal occipital brain with  surrounding edema. There are 2 lesions an the cerebellum and a few other scattered within the cerebral hemispheres.  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 This could represent metastatic disease or could be a primary lesion of some sort.   Electronically Signed   By: Nelson Chimes M.D.   On: 06/17/2014 21:47    Assessment/Plan: 68 year old lady presenting with new onset generalized seizure activity with associated metastatic brain lesions as described above, the largest of which involves the right parieto-occipital region with associated vasogenic edema. Primary neoplasm source is still to be determined.  Recommendations: 1. Keppra 1000 mg IV, followed by 500 mg every 12 hours by mouth. 2. Decadron 10 mg IV, followed by 4 mg IV every 6 hours. 3. CTA of chest abdomen and pelvis. 4. Oncology consult  We will continue to follow this patient with you.  C.R. Nicole Kindred, MD [facility] [phone]  06/17/2014, 10:30 PM

## 2014-06-17 NOTE — ED Notes (Addendum)
Pt. Had a seizure witnessed by son and sister.  Seizure lasted approximately few minutes.  Pt. Was sitting playing a gambling gamed at a bar.  Body tensed up , pt. Shook and foamed at the mouth.  Family assisted her to the floor. Paramedics arrived and pt. Was postictal,  Only new her name.  Presently she is alert and oriented X2  but does not remember the event.  Pt. Is not oriented to time.  Pt. Also was not incontinent or no injuries noted. Pt. Went to the dentist and was prescribed Penicillin and took 1000mg  this afternoon. Pt.'s neuro exam negative en route.  No neuro deficits noted

## 2014-06-17 NOTE — ED Notes (Signed)
Dr. Nicole Kindred at bedside.

## 2014-06-17 NOTE — ED Notes (Signed)
BP reported to [employer]

## 2014-06-18 ENCOUNTER — Inpatient Hospital Stay (HOSPITAL_COMMUNITY): Payer: Medicare HMO

## 2014-06-18 ENCOUNTER — Encounter (HOSPITAL_COMMUNITY): Payer: Self-pay | Admitting: Internal Medicine

## 2014-06-18 DIAGNOSIS — R569 Unspecified convulsions: Secondary | ICD-10-CM | POA: Diagnosis present

## 2014-06-18 DIAGNOSIS — R220 Localized swelling, mass and lump, head: Secondary | ICD-10-CM

## 2014-06-18 DIAGNOSIS — C7949 Secondary malignant neoplasm of other parts of nervous system: Secondary | ICD-10-CM

## 2014-06-18 DIAGNOSIS — I10 Essential (primary) hypertension: Secondary | ICD-10-CM

## 2014-06-18 DIAGNOSIS — R221 Localized swelling, mass and lump, neck: Secondary | ICD-10-CM

## 2014-06-18 DIAGNOSIS — C7931 Secondary malignant neoplasm of brain: Secondary | ICD-10-CM

## 2014-06-18 DIAGNOSIS — E279 Disorder of adrenal gland, unspecified: Secondary | ICD-10-CM

## 2014-06-18 DIAGNOSIS — R222 Localized swelling, mass and lump, trunk: Secondary | ICD-10-CM

## 2014-06-18 LAB — CBC WITH DIFFERENTIAL/PLATELET
BASOS ABS: 0 10*3/uL (ref 0.0–0.1)
Basophils Relative: 0 % (ref 0–1)
Eosinophils Absolute: 0 10*3/uL (ref 0.0–0.7)
Eosinophils Relative: 0 % (ref 0–5)
HCT: 40.2 % (ref 36.0–46.0)
Hemoglobin: 13.4 g/dL (ref 12.0–15.0)
Lymphocytes Relative: 7 % — ABNORMAL LOW (ref 12–46)
Lymphs Abs: 0.6 10*3/uL — ABNORMAL LOW (ref 0.7–4.0)
MCH: 30.1 pg (ref 26.0–34.0)
MCHC: 33.3 g/dL (ref 30.0–36.0)
MCV: 90.3 fL (ref 78.0–100.0)
Monocytes Absolute: 0.2 10*3/uL (ref 0.1–1.0)
Monocytes Relative: 2 % — ABNORMAL LOW (ref 3–12)
NEUTROS ABS: 7.6 10*3/uL (ref 1.7–7.7)
Neutrophils Relative %: 91 % — ABNORMAL HIGH (ref 43–77)
Platelets: 310 10*3/uL (ref 150–400)
RBC: 4.45 MIL/uL (ref 3.87–5.11)
RDW: 13.5 % (ref 11.5–15.5)
WBC: 8.3 10*3/uL (ref 4.0–10.5)

## 2014-06-18 LAB — MRSA PCR SCREENING: MRSA by PCR: NEGATIVE

## 2014-06-18 LAB — COMPREHENSIVE METABOLIC PANEL
ALBUMIN: 3.5 g/dL (ref 3.5–5.2)
ALT: 13 U/L (ref 0–35)
ANION GAP: 14 (ref 5–15)
AST: 18 U/L (ref 0–37)
Alkaline Phosphatase: 72 U/L (ref 39–117)
BILIRUBIN TOTAL: 0.6 mg/dL (ref 0.3–1.2)
BUN: 7 mg/dL (ref 6–23)
CO2: 22 mEq/L (ref 19–32)
CREATININE: 0.54 mg/dL (ref 0.50–1.10)
Calcium: 9 mg/dL (ref 8.4–10.5)
Chloride: 95 mEq/L — ABNORMAL LOW (ref 96–112)
GFR calc Af Amer: >90 mL/min (ref >90)
GFR calc non Af Amer: >90 mL/min (ref >90)
Glucose, Bld: 151 mg/dL — ABNORMAL HIGH (ref 70–99)
Potassium: 3.9 mEq/L (ref 3.7–5.3)
Sodium: 131 mEq/L — ABNORMAL LOW (ref 137–147)
Total Protein: 7 g/dL (ref 6.0–8.3)

## 2014-06-18 LAB — PROTIME-INR
INR: 1.2 (ref 0.00–1.49)
Prothrombin Time: 15.2 seconds (ref 11.6–15.2)

## 2014-06-18 LAB — TSH: TSH: 0.516 u[IU]/mL (ref 0.350–4.500)

## 2014-06-18 LAB — APTT: APTT: 36 s (ref 24–37)

## 2014-06-18 MED ORDER — ONDANSETRON HCL 4 MG PO TABS: 4.0000 mg | ORAL_TABLET | Freq: Four times a day (QID) | ORAL | Status: DC | PRN

## 2014-06-18 MED ORDER — HYDRALAZINE HCL 20 MG/ML IJ SOLN
10.0000 mg | INTRAMUSCULAR | Status: DC | PRN
  Administered 2014-06-18 – 2014-06-19 (×2): 10 mg via INTRAVENOUS
  Filled 2014-06-18 (×2): qty 1

## 2014-06-18 MED ORDER — ONDANSETRON HCL 4 MG/2ML IJ SOLN: 4.0000 mg | Freq: Four times a day (QID) | INTRAMUSCULAR | Status: DC | PRN

## 2014-06-18 MED ORDER — ACETAMINOPHEN 650 MG RE SUPP: 650.0000 mg | Freq: Four times a day (QID) | RECTAL | Status: DC | PRN

## 2014-06-18 MED ORDER — DEXAMETHASONE SODIUM PHOSPHATE 4 MG/ML IJ SOLN
4.0000 mg | Freq: Four times a day (QID) | INTRAMUSCULAR | Status: DC
  Administered 2014-06-18 – 2014-06-19 (×6): 4 mg via INTRAVENOUS
  Filled 2014-06-18 (×10): qty 1

## 2014-06-18 MED ORDER — SODIUM CHLORIDE 0.9 % IV SOLN
INTRAVENOUS | Status: AC
  Administered 2014-06-18 (×2): via INTRAVENOUS

## 2014-06-18 MED ORDER — LORAZEPAM 2 MG/ML IJ SOLN: 1.0000 mg | INTRAMUSCULAR | Status: DC | PRN

## 2014-06-18 MED ORDER — IOHEXOL 300 MG/ML  SOLN: 25.0000 mL | INTRAMUSCULAR | Status: AC

## 2014-06-18 MED ORDER — IOHEXOL 300 MG/ML  SOLN
100.0000 mL | Freq: Once | INTRAMUSCULAR | Status: AC | PRN
  Administered 2014-06-18: 100 mL via INTRAVENOUS

## 2014-06-18 MED ORDER — ENOXAPARIN SODIUM 40 MG/0.4ML ~~LOC~~ SOLN
40.0000 mg | Freq: Every day | SUBCUTANEOUS | Status: DC
  Administered 2014-06-18: 40 mg via SUBCUTANEOUS
  Filled 2014-06-18 (×2): qty 0.4

## 2014-06-18 MED ORDER — IOHEXOL 300 MG/ML  SOLN
25.0000 mL | INTRAMUSCULAR | Status: AC
  Administered 2014-06-18 (×2): 25 mL via ORAL

## 2014-06-18 MED ORDER — LEVETIRACETAM IN NACL 500 MG/100ML IV SOLN
500.0000 mg | Freq: Two times a day (BID) | INTRAVENOUS | Status: DC
  Administered 2014-06-18 – 2014-06-19 (×4): 500 mg via INTRAVENOUS
  Filled 2014-06-18 (×4): qty 100

## 2014-06-18 MED ORDER — ACETAMINOPHEN 325 MG PO TABS: 650.0000 mg | ORAL_TABLET | Freq: Four times a day (QID) | ORAL | Status: DC | PRN

## 2014-06-18 MED ORDER — BISOPROLOL FUMARATE 5 MG PO TABS
5.0000 mg | ORAL_TABLET | Freq: Every day | ORAL | Status: DC
  Administered 2014-06-18 – 2014-06-19 (×2): 5 mg via ORAL
  Filled 2014-06-18 (×2): qty 1

## 2014-06-18 MED ORDER — OMEGA-3-ACID ETHYL ESTERS 1 G PO CAPS
1000.0000 mg | ORAL_CAPSULE | Freq: Every day | ORAL | Status: DC
  Administered 2014-06-18 – 2014-06-19 (×2): 1000 mg via ORAL
  Filled 2014-06-18 (×2): qty 1

## 2014-06-18 MED ORDER — LEVOFLOXACIN IN D5W 750 MG/150ML IV SOLN
750.0000 mg | Freq: Every day | INTRAVENOUS | Status: DC
  Administered 2014-06-18 (×2): 750 mg via INTRAVENOUS
  Filled 2014-06-18 (×3): qty 150

## 2014-06-18 NOTE — Progress Notes (Signed)
CT called about CT results. Attempted to page Dr. Inis Sizer, Dr. Alen Blew and Len Blalock PA-C to report CT result back.

## 2014-06-18 NOTE — H&P (Signed)
Referring Physician: Dr. Alen Blew  HPI: Kristina Lawson is an 68 y.o. female who presented with a seizure. CT/MRI of the brain revealed brain masses and CT chest abdomen/pelvis revealed lung mass, left adrenal mass, hilar and mediastinal lymphadenopathy concerning for metastatic disease. IR received request for biopsy. Dr. Kathlene Cote has reviewed the imaging and recommends lung biopsy with bronchoscopy or left adrenal mass biopsy. Dr. Alen Blew would like to proceed with left adrenal mass biopsy. The patient is seen today with her family present in the room. She c/o right shoulder pain. She denies any chest pain, shortness of breath or palpitations. She denies any active signs of bleeding or excessive bruising. She denies any recent fever or chills.  Past Medical History:  Past Medical History  Diagnosis Date  . Hypertension   . Smoker   . Hyperlipidemia     Past Surgical History: History reviewed. No pertinent past surgical history.  Family History:  Family History  Problem Relation Age of Onset  . Heart disease Mother     Social History:  reports that she has been smoking.  She does not have any smokeless tobacco history on file. She reports that she drinks alcohol. She reports that she does not use illicit drugs.  Allergies: No Active Allergies  Medications:   Medication List    ASK your doctor about these medications       bisoprolol-hydrochlorothiazide 5-6.25 MG per tablet  Commonly known as:  ZIAC  Take 1 tablet by mouth daily.     Fish Oil 1200 MG Caps  Take 1,200 mg by mouth daily.     penicillin v potassium 500 MG tablet  Commonly known as:  VEETID  Take 500 mg by mouth 4 (four) times daily.     Vitamin D 2000 UNITS tablet  Take 2,000 Units by mouth daily.       Please HPI for pertinent positives, otherwise complete 10 system ROS negative.  Physical Exam: BP 157/72  Pulse 78  Temp(Src) 98.8 F (37.1 C) (Oral)  Resp 20  Ht 5' 3"  (1.6 m)  Wt 173 lb 8 oz (78.7 kg)   BMI 30.74 kg/m2  SpO2 90% Body mass index is 30.74 kg/(m^2).  General Appearance:  Alert, cooperative, no distress, family in room  Head:  Normocephalic, without obvious abnormality, atraumatic  Neck: Supple, symmetrical, trachea midline  Lungs:   Clear to auscultation bilaterally, no w/r/r, respirations unlabored without use of accessory muscles.  Chest Wall:  No tenderness or deformity  Heart:  Regular rate and rhythm, S1, S2 normal, no murmur, rub or gallop.  Abdomen:   Soft, non-tender, non distended.  Extremities: RUE painful to movement, atraumatic, no cyanosis or edema  Neurologic: Normal affect, no gross deficits.   Results for orders placed during the hospital encounter of 06/17/14 (from the past 48 hour(s))  BASIC METABOLIC PANEL     Status: Abnormal   Collection Time    06/17/14  6:41 PM      Result Value Ref Range   Sodium 126 (*) 137 - 147 mEq/L   Potassium 4.3  3.7 - 5.3 mEq/L   Chloride 86 (*) 96 - 112 mEq/L   CO2 23  19 - 32 mEq/L   Glucose, Bld 145 (*) 70 - 99 mg/dL   BUN 9  6 - 23 mg/dL   Creatinine, Ser 0.74  0.50 - 1.10 mg/dL   Calcium 9.5  8.4 - 10.5 mg/dL   GFR calc non Af Amer 87 (*) >90 mL/min  GFR calc Af Amer >90  >90 mL/min   Comment: (NOTE)     The eGFR has been calculated using the CKD EPI equation.     This calculation has not been validated in all clinical situations.     eGFR's persistently <90 mL/min signify possible Chronic Kidney     Disease.   Anion gap 17 (*) 5 - 15  CBC     Status: Abnormal   Collection Time    06/17/14  6:41 PM      Result Value Ref Range   WBC 11.1 (*) 4.0 - 10.5 K/uL   RBC 4.68  3.87 - 5.11 MIL/uL   Hemoglobin 14.2  12.0 - 15.0 g/dL   HCT 42.3  36.0 - 46.0 %   MCV 90.4  78.0 - 100.0 fL   MCH 30.3  26.0 - 34.0 pg   MCHC 33.6  30.0 - 36.0 g/dL   RDW 13.5  11.5 - 15.5 %   Platelets 299  150 - 400 K/uL  CBG MONITORING, ED     Status: Abnormal   Collection Time    06/17/14  7:10 PM      Result Value Ref Range    Glucose-Capillary 133 (*) 70 - 99 mg/dL   Comment 1 Notify RN    MRSA PCR SCREENING     Status: None   Collection Time    06/17/14 11:39 PM      Result Value Ref Range   MRSA by PCR NEGATIVE  NEGATIVE   Comment:            The GeneXpert MRSA Assay (FDA     approved for NASAL specimens     only), is one component of a     comprehensive MRSA colonization     surveillance program. It is not     intended to diagnose MRSA     infection nor to guide or     monitor treatment for     MRSA infections.  COMPREHENSIVE METABOLIC PANEL     Status: Abnormal   Collection Time    06/18/14  2:53 AM      Result Value Ref Range   Sodium 131 (*) 137 - 147 mEq/L   Potassium 3.9  3.7 - 5.3 mEq/L   Chloride 95 (*) 96 - 112 mEq/L   CO2 22  19 - 32 mEq/L   Glucose, Bld 151 (*) 70 - 99 mg/dL   BUN 7  6 - 23 mg/dL   Creatinine, Ser 0.54  0.50 - 1.10 mg/dL   Calcium 9.0  8.4 - 10.5 mg/dL   Total Protein 7.0  6.0 - 8.3 g/dL   Albumin 3.5  3.5 - 5.2 g/dL   AST 18  0 - 37 U/L   ALT 13  0 - 35 U/L   Alkaline Phosphatase 72  39 - 117 U/L   Total Bilirubin 0.6  0.3 - 1.2 mg/dL   GFR calc non Af Amer >90  >90 mL/min   GFR calc Af Amer >90  >90 mL/min   Comment: (NOTE)     The eGFR has been calculated using the CKD EPI equation.     This calculation has not been validated in all clinical situations.     eGFR's persistently <90 mL/min signify possible Chronic Kidney     Disease.   Anion gap 14  5 - 15  CBC WITH DIFFERENTIAL     Status: Abnormal   Collection Time  06/18/14  2:53 AM      Result Value Ref Range   WBC 8.3  4.0 - 10.5 K/uL   RBC 4.45  3.87 - 5.11 MIL/uL   Hemoglobin 13.4  12.0 - 15.0 g/dL   HCT 40.2  36.0 - 46.0 %   MCV 90.3  78.0 - 100.0 fL   MCH 30.1  26.0 - 34.0 pg   MCHC 33.3  30.0 - 36.0 g/dL   RDW 13.5  11.5 - 15.5 %   Platelets 310  150 - 400 K/uL   Neutrophils Relative % 91 (*) 43 - 77 %   Neutro Abs 7.6  1.7 - 7.7 K/uL   Lymphocytes Relative 7 (*) 12 - 46 %   Lymphs Abs  0.6 (*) 0.7 - 4.0 K/uL   Monocytes Relative 2 (*) 3 - 12 %   Monocytes Absolute 0.2  0.1 - 1.0 K/uL   Eosinophils Relative 0  0 - 5 %   Eosinophils Absolute 0.0  0.0 - 0.7 K/uL   Basophils Relative 0  0 - 1 %   Basophils Absolute 0.0  0.0 - 0.1 K/uL  TSH     Status: None   Collection Time    06/18/14  2:53 AM      Result Value Ref Range   TSH 0.516  0.350 - 4.500 uIU/mL   Dg Chest 2 View  06/17/2014   CLINICAL DATA:  Seizure.  EXAM: CHEST  2 VIEW  COMPARISON:  None  FINDINGS: The lungs are well-aerated. Focal right basilar airspace opacity is compatible with pneumonia, with additional right perihilar opacity seen. Mild vascular congestion is noted. No pleural effusion or pneumothorax is identified.  The heart is mildly enlarged. No acute osseous abnormalities are seen.  IMPRESSION: 1. Focal right basilar and right perihilar airspace opacity is compatible with pneumonia. Would treat for pneumonia, then perform follow-up chest radiograph to ensure resolution of airspace opacity. If the patient has no symptoms for pneumonia, would correlate to exclude aspiration. Mass cannot be excluded, but is considered less likely given the appearance of the opacity. 2. Mild vascular congestion and mild cardiomegaly noted.   Electronically Signed   By: Garald Balding M.D.   On: 06/17/2014 22:13   Ct Head Wo Contrast   (if New Onset Seizure And/or Head Trauma)  06/17/2014   CLINICAL DATA:  Seizures  EXAM: CT HEAD WITHOUT CONTRAST  TECHNIQUE: Contiguous axial images were obtained from the base of the skull through the vertex without intravenous contrast.  COMPARISON:  None.  FINDINGS: Abnormal white matter hypodensity in the right occipital lobe and right posterior parietal lobe is asymmetric. Although the possibility of some cortical involvement is raised, this primarily appears to involve the white matter and is accordingly suspicious for vasogenic edema. Right upper parietal gyri appear asymmetric on image 25 of  series 2.  The brainstem, cerebellum, basal ganglia, ventricular system, and basilar cisterns appear otherwise unremarkable. There is atherosclerotic calcification of the cavernous carotid arteries bilaterally.  IMPRESSION: 1. Asymmetric abnormal white matter hypodensity in the right occipital lobe and right parietal lobe posteriorly. This primarily resembles vasogenic edema and raises the possibility of underlying tumor, encephalitis, demyelinating process, or less likely an unusual ischemic process. Posterior reversible encephalopathy is considered less likely given the unilaterality. MRI brain with and without contrast is recommended. Also consider obtaining a chest radiograph.  These results were called by telephone at the time of interpretation on 06/17/2014 at 8:12 pm to Dr. Lajean Saver,  who verbally acknowledged these results.   Electronically Signed   By: Sherryl Barters M.D.   On: 06/17/2014 20:13   Ct Chest W Contrast  06/18/2014   CLINICAL DATA:  Known intracranial metastatic disease  EXAM: CT CHEST, ABDOMEN, AND PELVIS WITH CONTRAST  TECHNIQUE: Multidetector CT imaging of the chest, abdomen and pelvis was performed following the standard protocol during bolus administration of intravenous contrast.  CONTRAST:  135m OMNIPAQUE IOHEXOL 300 MG/ML  SOLN  COMPARISON:  Chest x-ray from the previous day  FINDINGS: CT CHEST FINDINGS  The left lung is well aerated. A 9 mm nodule is noted within the left lateral costophrenic angle. The right lung is well aerated but demonstrates evidence of a the lobular mass lesion which measures at least 2.6 x 2.3 cm within the right upper lobe posteriorly. Some associated atelectatic changes are noted. Additionally there is significant sub carinal and right hilar and right paratracheal lymphadenopathy. The right paratracheal adenopathy measures 2.8 x 2.3 cm. The subcarinal and right hilar adenopathy is confluent and measures at least 7.0 by 5.6 cm. It encases the superior  vena cava and right pulmonary artery and causes some stenosis but no obstruction is seen. No thrombus is noted. Coronary calcifications are seen.  CT ABDOMEN AND PELVIS FINDINGS  The liver, gallbladder, spleen and right adrenal gland are within normal limits. The pancreas is unremarkable. The left adrenal gland demonstrates a 3.4 x 3.2 cm mass lesion consistent with metastatic disease. Kidneys are well visualized and demonstrate evidence of portion to deformity with an apparent central fibrous band. No renal calculi or obstructive changes are noted. Renal vascular calcifications are seen. Diffuse aortic calcifications are noted without aneurysmal dilatation.  The appendix is within normal limits. Mild diverticular change is noted without evidence of diverticulitis. The bladder is well distended. The uterus show some scattered calcifications likely related uterine fibroid disease. No definitive bony metastatic disease is noted.  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 Vascular encasement without occlusion or thrombosis is noted.  These results will be called to the ordering clinician or representative by the Radiologist Assistant, and communication documented in the PACS or zVision Dashboard.   Electronically Signed   By: MInez CatalinaM.D.   On: 06/18/2014 13:54   Mr BJeri CosWLXContrast  06/17/2014   CLINICAL DATA:  Seizure.  Abnormal head CT.  EXAM: MRI HEAD WITHOUT AND WITH CONTRAST  TECHNIQUE: Multiplanar, multiecho pulse sequences of the brain and surrounding structures were obtained without and with intravenous contrast.  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  There  is a 14 mm avidly enhancing lesion in the right parieto-occipital region without central necrosis. There is pronounced regional vasogenic edema. There is a second lesion within the cerebellum on the right measuring 4 mm in diameter. There is a third lesion within the inferior cerebellum on the left measuring 5 mm in diameter. There is a 4 mm lesion in the right frontal region. There is a questionable fifth lesion deep to the insula on the left measuring about 4 mm.  No evidence of hydrocephalus.  No extra-axial fluid collection.  There is an extensive lesion centered within the  sphenoid bone on the left with enhancing mass extending along the lateral margin and floor of the middle cranial fossa and into the masticator space. This is presumed to represent an additional site of metastatic disease. Theoretically, this could be a primary lesion from which the brain metastases arose.  IMPRESSION: 4 and possibly 5 brain masses consistent with metastatic disease. The largest lesion was visible at CT. This represents a 14 mm mass in the right parietal occipital brain with surrounding edema. There are 2 lesions an the cerebellum and a few other scattered within the cerebral hemispheres.  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 This could represent metastatic disease or could be a primary lesion of some sort.   Electronically Signed   By: Nelson Chimes M.D.   On: 06/17/2014 21:47   Ct Abdomen Pelvis W Contrast  06/18/2014   CLINICAL DATA:  Known intracranial metastatic disease  EXAM: CT CHEST, ABDOMEN, AND PELVIS WITH CONTRAST  TECHNIQUE: Multidetector CT imaging of the chest, abdomen and pelvis was performed following the standard protocol during bolus administration of intravenous contrast.  CONTRAST:  181m OMNIPAQUE IOHEXOL 300 MG/ML  SOLN  COMPARISON:  Chest x-ray from the  previous day  FINDINGS: CT CHEST FINDINGS  The left lung is well aerated. A 9 mm nodule is noted within the left lateral costophrenic angle. The right lung is well aerated but demonstrates evidence of a the lobular mass lesion which measures at least 2.6 x 2.3 cm within the right upper lobe posteriorly. Some associated atelectatic changes are noted. Additionally there is significant sub carinal and right hilar and right paratracheal lymphadenopathy. The right paratracheal adenopathy measures 2.8 x 2.3 cm. The subcarinal and right hilar adenopathy is confluent and measures at least 7.0 by 5.6 cm. It encases the superior vena cava and right pulmonary artery and causes some stenosis but no obstruction is seen. No thrombus is noted. Coronary calcifications are seen.  CT ABDOMEN AND PELVIS FINDINGS  The liver, gallbladder, spleen and right adrenal gland are within normal limits. The pancreas is unremarkable. The left adrenal gland demonstrates a 3.4 x 3.2 cm mass lesion consistent with metastatic disease. Kidneys are well visualized and demonstrate evidence of portion to deformity with an apparent central fibrous band. No renal calculi or obstructive changes are noted. Renal vascular calcifications are seen. Diffuse aortic calcifications are noted without aneurysmal dilatation.  The appendix is within normal limits. Mild diverticular change is noted without evidence of diverticulitis. The bladder is well distended. The uterus show some scattered calcifications likely related uterine fibroid disease. No definitive bony metastatic disease is noted.  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 Vascular encasement without occlusion or thrombosis is noted.  These results will be called to the ordering clinician or representative by the  Radiologist Assistant, and communication documented in the PACS or zVision Dashboard.   Electronically Signed   By: MInez CatalinaM.D.   On: 06/18/2014 13:54    Assessment/Plan Seizures Brain masses on CT/MRI Tobacco abuse Lung mass, not amendable to percutaneous biopsy-too high risk with surrounding vessels and would need to cross a fissure, bronchoscopy less desired by Oncology secondary to chance of low  yield compared to adrenal biopsy.  Left adrenal mass, amendable to biopsy Will keep NPO after midnight, hold blood thinners Images reviewed by Dr. Kathlene Cote today, D/w Dr. Alen Blew today, await INR. Risks and Benefits discussed with the patient and her family. All questions were answered, patient and family are agreeable to proceed. Consent signed and in chart.   Tsosie Billing D PA-C 06/18/2014, 3:58 PM

## 2014-06-18 NOTE — Progress Notes (Signed)
[facility] PROGRESS NOTE  Kristina Lawson YIA:[id number] DOB: 05-Jan-1946 DOA: 06/17/2014 PCP: Jenna Luo TOM, MD I have seen and examined pt who is a 68yo admitted this am by Dr Hal Hope with history of hypertension and hyperlipidemia presenting with new onset seizures MRI with 4 possibly 5 brain masses consistent with metastatic disease, the largest 1 no surrounding edema. Patient was seen by neurology and started on Keppra. She was also started on IV Decadron. CT scan of chest abdomen and pelvis have been and pending at this time. I have consulted oncology for further recommendations. We'll continue current management plan as per Hal Hope and Follow up on pending studies and further manage accordingly     [facility] Pager [phone]. If 7PM-7AM, please contact night-coverage at [email or web address], password [facility] 06/18/2014, 9:05 AM  LOS: 1 day

## 2014-06-18 NOTE — Progress Notes (Signed)
Took pt to xray to have xray of right arm. Pt now back. Pt's blood pressure has been increasing over the last several hours. Pt is asymptomatic. Gave pt IV apresoline PRN for B/p greater than 160 per MD orders. Pt's systolic b/p is in 446'X. Will monitor.

## 2014-06-18 NOTE — H&P (Addendum)
[facility] History and Physical  Kristina Lawson ZOX:[id number] DOB: 1946/08/08 DOA: 06/17/2014  Referring physician: ER physician. PCP: Kristina Fraction, MD   Chief Complaint: Seizures.  HPI: Kristina Lawson is a 68 y.o. female with history of hypertension and hyperlipidemia was brought to the ER after patient had an episode of seizures. As per the family patient's seizure was witnessed by patient's friend when patient suddenly had jerking movements of her right upper extremity and turning head towards the right side and patient was consciousness. Patient did not have any associated incontinence of urine or tongue bite. Patient was recently placed on antibiotics for possible tooth infection as patient was noticing increasing swelling of the left maxillary area. Patient also was having pain in the right shoulder area for last one week. In the ER patient initially had CT head followed by MRI brain which showed  - "4 and possibly 5 brain masses consistent with metastatic disease. The largest lesion was visible at CT. This represents a 14 mm mass in the right parietal occipital brain with surrounding edema. There are 2 lesions an the cerebellum and a few other scattered within the cerebral hemispheres. 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 This could represent metastatic disease or could be a primary lesion of some sort.". Neurologist on-call Dr. Nicole Kindred was consulted patient was placed on Decadron and Keppra and admitted for further workup. Patient on my exam is alert awake and oriented and is able to move all extremities. Still complains of right shoulder pain. Denies any chest pain or shortness of breath nausea vomiting abdominal pain productive cough or fever chills.   Review of Systems: As presented in the history of presenting illness, rest  negative.  Past Medical History  Diagnosis Date  . Hypertension   . Smoker   . Hyperlipidemia    History reviewed. No pertinent past surgical history. Social History:  reports that she has been smoking.  She does not have any smokeless tobacco history on file. She reports that she drinks alcohol. She reports that she does not use illicit drugs. Where does patient live home. Can patient participate in ADLs? Yes.  No Active Allergies  Family History:  Family History  Problem Relation Age of Onset  . Heart disease Mother       Prior to Admission medications   Medication Sig Start Date End Date Taking? Authorizing Provider  bisoprolol-hydrochlorothiazide (ZIAC) 5-6.25 MG per tablet Take 1 tablet by mouth daily.   Yes Historical Provider, MD  Cholecalciferol (VITAMIN D) 2000 UNITS tablet Take 2,000 Units by mouth daily.   Yes Historical Provider, MD  Omega-3 Fatty Acids (FISH OIL) 1200 MG CAPS Take 1,200 mg by mouth daily.    Yes Historical Provider, MD  penicillin v potassium (VEETID) 500 MG tablet Take 500 mg by mouth 4 (four) times daily.   Yes Historical Provider, MD    Physical Exam: Filed Vitals:   06/17/14 2230 06/17/14 2235 06/17/14 2350 06/18/14 0020  BP: 211/81 211/84  180/82  Pulse: 84 84  86  Temp:   97.9 F (36.6 C) 97.6 F (36.4 C)  TempSrc:   Oral Oral  Resp: 19 21    Height:   5\' 3"  (1.6 m)   Weight:   78.7 kg (173 lb 8 oz)   SpO2: 90% 90%  92%     General:  Well-developed well-nourished.  Eyes: Anicteric no pallor.  ENT: Left maxillary swelling.  Neck: No neck rigidity or mass.  Cardiovascular: S1-S2 heard.  Respiratory: No rhonchi or crepitations.  Abdomen: Soft nontender bowel sounds present. No guarding or rigidity.  Skin: No rash.  Musculoskeletal: Pain on abducting right arm. No obvious swelling.  Psychiatric: Appears normal.  Neurologic: Alert and oriented to time place and person. Moves all extremities.  Labs on Admission:  Basic  Metabolic Panel:  Recent Labs Lab 06/17/14 1841  NA 126*  K 4.3  CL 86*  CO2 23  GLUCOSE 145*  BUN 9  CREATININE 0.74  CALCIUM 9.5   Liver Function Tests: No results found for this basename: AST, ALT, ALKPHOS, BILITOT, PROT, ALBUMIN,  in the last 168 hours No results found for this basename: LIPASE, AMYLASE,  in the last 168 hours No results found for this basename: AMMONIA,  in the last 168 hours CBC:  Recent Labs Lab 06/17/14 1841  WBC 11.1*  HGB 14.2  HCT 42.3  MCV 90.4  PLT 299   Cardiac Enzymes: No results found for this basename: CKTOTAL, CKMB, CKMBINDEX, TROPONINI,  in the last 168 hours  BNP (last 3 results) No results found for this basename: PROBNP,  in the last 8760 hours CBG:  Recent Labs Lab 06/17/14 1910  GLUCAP 133*    Radiological Exams on Admission: Dg Chest 2 View  06/17/2014   CLINICAL DATA:  Seizure.  EXAM: CHEST  2 VIEW  COMPARISON:  None  FINDINGS: The lungs are well-aerated. Focal right basilar airspace opacity is compatible with pneumonia, with additional right perihilar opacity seen. Mild vascular congestion is noted. No pleural effusion or pneumothorax is identified.  The heart is mildly enlarged. No acute osseous abnormalities are seen.  IMPRESSION: 1. Focal right basilar and right perihilar airspace opacity is compatible with pneumonia. Would treat for pneumonia, then perform follow-up chest radiograph to ensure resolution of airspace opacity. If the patient has no symptoms for pneumonia, would correlate to exclude aspiration. Mass cannot be excluded, but is considered less likely given the appearance of the opacity. 2. Mild vascular congestion and mild cardiomegaly noted.   Electronically Signed   By: Garald Balding M.D.   On: 06/17/2014 22:13   Ct Head Wo Contrast   (if New Onset Seizure And/or Head Trauma)  06/17/2014   CLINICAL DATA:  Seizures  EXAM: CT HEAD WITHOUT CONTRAST  TECHNIQUE: Contiguous axial images were obtained from the base  of the skull through the vertex without intravenous contrast.  COMPARISON:  None.  FINDINGS: Abnormal white matter hypodensity in the right occipital lobe and right posterior parietal lobe is asymmetric. Although the possibility of some cortical involvement is raised, this primarily appears to involve the white matter and is accordingly suspicious for vasogenic edema. Right upper parietal gyri appear asymmetric on image 25 of series 2.  The brainstem, cerebellum, basal ganglia, ventricular system, and basilar cisterns appear otherwise unremarkable. There is atherosclerotic calcification of the cavernous carotid arteries bilaterally.  IMPRESSION: 1. Asymmetric abnormal white matter hypodensity in the right occipital lobe and right parietal lobe posteriorly. This primarily resembles vasogenic edema and raises the possibility of underlying tumor, encephalitis, demyelinating process, or less likely an unusual ischemic process. Posterior reversible encephalopathy is considered less likely given the unilaterality. MRI brain with and without contrast is recommended. Also consider obtaining a chest radiograph.  These results were called by telephone at the time of interpretation on 06/17/2014 at 8:12 pm to Dr.  Lajean Saver, who verbally acknowledged these results.   Electronically Signed   By: Sherryl Barters M.D.   On: 06/17/2014 20:13   Mr Jeri Cos QM Contrast  06/17/2014   CLINICAL DATA:  Seizure.  Abnormal head CT.  EXAM: MRI HEAD WITHOUT AND WITH CONTRAST  TECHNIQUE: Multiplanar, multiecho pulse sequences of the brain and surrounding structures were obtained without and with intravenous contrast.  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  There is a 14 mm avidly enhancing lesion in the right parieto-occipital region without central necrosis. There is pronounced regional  vasogenic edema. There is a second lesion within the cerebellum on the right measuring 4 mm in diameter. There is a third lesion within the inferior cerebellum on the left measuring 5 mm in diameter. There is a 4 mm lesion in the right frontal region. There is a questionable fifth lesion deep to the insula on the left measuring about 4 mm.  No evidence of hydrocephalus.  No extra-axial fluid collection.  There is an extensive lesion centered within the sphenoid bone on the left with enhancing mass extending along the lateral margin and floor of the middle cranial fossa and into the masticator space. This is presumed to represent an additional site of metastatic disease. Theoretically, this could be a primary lesion from which the brain metastases arose.  IMPRESSION: 4 and possibly 5 brain masses consistent with metastatic disease. The largest lesion was visible at CT. This represents a 14 mm mass in the right parietal occipital brain with surrounding edema. There are 2 lesions an the cerebellum and a few other scattered within the cerebral hemispheres.  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 This could represent metastatic disease or could be a primary lesion of some sort.   Electronically Signed   By: Nelson Chimes M.D.   On: 06/17/2014 21:47     Assessment/Plan Principal Problem:   Generalized seizure Active Problems:   Brain metastasis   Brain metastases   HTN (hypertension)   Seizures   1. Generalized seizures with possible metastatic lesions in the brain - primary is not clear. CT chest and abdomen and pelvis has been ordered. Patient has been continued on Decadron and Keppra as advised by neurologist. Following CT chest and abdomen patient may need interventional radiology consult for possible biopsy and eventual oncology consult. Patient has been placed  on seizure precautions. Since chest x-ray showing possible pneumonic process patient has been empirically placed on antibiotics which can be discontinued CT chest does not show any definite pneumonic process. 2. Hypertension - continue bisoprolol and patient has been placed on when necessary IV hydralazine. Discontinue HCTZ due to hyponatremia. 3. Mild hyponatremia - probably related to possible malignancy. Discontinue HCTZ and closely follow metabolic panel. 4. History of hyperlipidemia - continue present medications. 5. Tobacco abuse - tobacco cessation counseling requested.    Code Status: Full code.  Family Communication: Family at the bedside.  Disposition Plan: Admit to inpatient.    KAKRAKANDY,ARSHAD N. [facility] Pager [phone].  If 7PM-7AM, please contact night-coverage [email or web address] Password [facility] 06/18/2014, 12:56 AM

## 2014-06-18 NOTE — Progress Notes (Signed)
Subjective: No further seizures  Exam: Filed Vitals:   06/18/14 0830  BP: 154/90  Pulse: 88  Temp: 97.9 F (36.6 C)  Resp: 17   Gen: In bed, NAD MS: Awake, alert, interactive and appropriate CN: Pupils round and reactive to light, extraocular movements intact, visual fields are full to gross confrontation Motor: Impaired right arm abduction do to significant pain of the right humerus. Sensory: Intact to light touch    Impression:  68 year old female with likely metastatic disease to the brain. Will need workup defined primary for appropriate treatment. I would be very concerned that her right humeral pain is due to metastatic lesion.  Recommendations: 1) Decadron 4 mg every 6 hours 2) Keppra 500 mg twice a day 3) CT chest abdomen and pelvis is pending. 4) x-ray of right arm  Roland Rack, MD [facility] [phone]  If 7pm- 7am, please page neurology on call as listed in [place].

## 2014-06-18 NOTE — Consult Note (Signed)
[name]                                MR#: [id number]  DOB: 1946/08/08                       CSN#: [id number]  Referring MD: Dr. Sheliah [facility]  PCP: Kristina Fraction, MD    Reason for Consult: Brain Lesions  WNU:[id number] Mussell is a 67 y.o. female smoker admitted on 8/10 due to a witnessed generalized seizure. On presentation, symptoms included left facial swelling without aphasia or dysarthria, and right shoulder pain over the last week. She denied any vision changes or confusion. She denied any loss of consciousness during the episode. She had never had any other seizures in the past. No gait instability. Denied urinary or bowel incontinence.Denies weight loss or appetite changes.  She had no cardiac or respiratory complaints. She denies any family history of lung or brain cancer. There is a history  of breast cancer in the immediate family.  CT scan of her head on 8/10 showed an area of hypodensity in the right parieto-occipital region indicative of vasogenic edema. She was placed on Keppra and Decadron without seizure recurrence.   MRI of the brain with and without contrast on 8/10 showed 4 and possibly 5 brain masses consistent with metastatic disease. The largest lesion was in the right parieto-occipital region measuring 14 mm with surrounding edema. There are 2 lesions an the cerebellum and a few other scattered within the cerebral hemispheres. Large mass involving the sphenoid bone on the left with tumor growing into the lateral aspect and floor of the middle cranial fossa and into the soft tissues of the masticator space were noted suspicious for metastatic disease.  Chest X ray showed focal right basilar and right perihilar airspace opacity is compatible with pneumonia, but due to brain lesions, malignancy could not be excluded. CT of the chest, abdomen and pelvis are currently  pending. Oncology has been requested to see the patient with recommendations        PMH:  Past Medical History  Diagnosis Date  . Hypertension   . Smoker   . Hyperlipidemia     Surgeries:  History reviewed. No pertinent past surgical history.  Allergies: No Active Allergies  Medications:   Scheduled Meds: . bisoprolol  5 mg Oral Daily  . dexamethasone  4 mg Intravenous 4 times per day  . enoxaparin (LOVENOX) injection  40 mg Subcutaneous Daily  . iohexol  25 mL Oral Q1 Hr x 2  . levETIRAcetam  500 mg Intravenous Q12H  . levofloxacin (LEVAQUIN) IV  750 mg Intravenous QHS  . omega-3 acid ethyl esters  1,000 mg Oral Daily   Continuous Infusions: . sodium chloride 50 mL/hr at 06/18/14 0140   PRN Meds:.acetaminophen, acetaminophen, hydrALAZINE, LORazepam, ondansetron (ZOFRAN) IV, ondansetron  ROS: Constitutional: Denies fevers, chills or abnormal night sweats Eyes: Denies blurriness of vision, double vision or watery eyes Ears, nose, mouth, throat, and face: Denies mucositis or sore throat Respiratory: Denies cough, dyspnea or wheezes Cardiovascular: Denies palpitation, chest discomfort or lower extremity swelling Breasts: No galactorrhea. Denies new breast masses.  Gastrointestinal:  Denies nausea, heartburn or change in bowel habits Skin: Denies abnormal skin rashes Lymphatics: Denies new lymphadenopathy or easy bruising GU: denies incontinence, hematuria or dysuria.  No vaginal bleeding.  Neurological:as per HPI Behavioral/Psych: Mood is stable, no new changes  All other systems were reviewed with the patient and are negative.  Health maintenance: She is not up to date with screening tests such as colonoscopy, vaccination and mammograms (last in 2000, negative)                                        Last PAP on 02/12/14 showed squamous/atypical cells of unknown significance.  (she did not follow up)    Family History:    Family History  Problem Relation Age of Onset   . Heart disease Mother        Father died with heart disease      2 sisters alive with breast cancer      1 aunt died with breast cancer.   Social History: Smokes < 1ppd for the last 15 + years. She does not have any smokeless tobacco history on file. She reports that she drinks alcohol on social occasions. She reports that she does not use illicit drugs. Single. 1 son in good health. Retired Education officer, [employer].    Physical Exam   ECOG PERFORMANCE STATUS: 0 Asymptomatic (Fully active, able to carry on all predisease activities without restriction)  Filed Vitals:   06/18/14 0830  BP: 154/90  Pulse: 88  Temp: 97.9 F (36.6 C)  Resp: 17   Filed Weights   06/17/14 2350  Weight: 173 lb 8 oz (78.7 kg)    GENERAL: 68 year old AAF, alert, no distress and comfortable SKIN: skin color, texture, turgor are normal, no rashes or significant lesions EYES: normal, conjunctiva are pink and non-injected, sclera clear.No Horner's.  Mild left facial swelling.  OROPHARYNX:no exudate, no erythema and lips, buccal mucosa, and tongue normal  NECK: supple, thyroid normal size, non-tender, without nodularity LYMPH:  no palpable lymphadenopathy in the cervical, axillary or inguinal LUNGS: clear to auscultation and percussion with normal breathing effort HEART: regular rate & rhythm and no murmurs and no lower extremity edema ABDOMEN:abdomen soft, non-tender and normal bowel sounds Musculoskeletal:no cyanosis of digits and no clubbing. Right arm pain on abduction, but improved from prior day PSYCH: alert & oriented x 3 with fluent speech NEURO: no focal motor/sensory deficits  CBC  Recent Labs Lab 06/17/14 1841 06/18/14 0253  WBC 11.1* 8.3  HGB 14.2 13.4  HCT 42.3 40.2  PLT 299 310  MCV 90.4 90.3  MCH 30.3 30.1  MCHC 33.6 33.3  RDW 13.5 13.5  LYMPHSABS  --  0.6*  MONOABS  --  0.2  EOSABS  --  0.0  BASOSABS  --  0.0     CMP    Recent Labs Lab 06/17/14 1841 06/18/14 0253  NA 126*  131*  K 4.3 3.9  CL 86* 95*  CO2 23 22  GLUCOSE 145* 151*  BUN 9 7  CREATININE 0.74 0.54  CALCIUM 9.5 9.0  AST  --  18  ALT  --  13  ALKPHOS  --  72  BILITOT  --  0.6        Component Value Date/Time   BILITOT 0.6 06/18/2014 0253     Imaging Studies:  Dg Chest 2 View  06/17/2014   CLINICAL DATA:  Seizure.  EXAM: CHEST  2 VIEW  COMPARISON:  None  FINDINGS: The lungs are well-aerated. Focal right basilar airspace opacity is compatible with  pneumonia, with additional right perihilar opacity seen. Mild vascular congestion is noted. No pleural effusion or pneumothorax is identified.  The heart is mildly enlarged. No acute osseous abnormalities are seen.  IMPRESSION: 1. Focal right basilar and right perihilar airspace opacity is compatible with pneumonia. Would treat for pneumonia, then perform follow-up chest radiograph to ensure resolution of airspace opacity. If the patient has no symptoms for pneumonia, would correlate to exclude aspiration. Mass cannot be excluded, but is considered less likely given the appearance of the opacity. 2. Mild vascular congestion and mild cardiomegaly noted.   Electronically Signed   By: Garald Balding M.D.   On: 06/17/2014 22:13   Ct Head Wo Contrast   (if New Onset Seizure And/or Head Trauma)  06/17/2014   CLINICAL DATA:  Seizures  EXAM: CT HEAD WITHOUT CONTRAST  TECHNIQUE: Contiguous axial images were obtained from the base of the skull through the vertex without intravenous contrast.  COMPARISON:  None.  FINDINGS: Abnormal white matter hypodensity in the right occipital lobe and right posterior parietal lobe is asymmetric. Although the possibility of some cortical involvement is raised, this primarily appears to involve the white matter and is accordingly suspicious for vasogenic edema. Right upper parietal gyri appear asymmetric on image 25 of series 2.  The brainstem, cerebellum, basal ganglia, ventricular system, and basilar cisterns appear otherwise  unremarkable. There is atherosclerotic calcification of the cavernous carotid arteries bilaterally.  IMPRESSION: 1. Asymmetric abnormal white matter hypodensity in the right occipital lobe and right parietal lobe posteriorly. This primarily resembles vasogenic edema and raises the possibility of underlying tumor, encephalitis, demyelinating process, or less likely an unusual ischemic process. Posterior reversible encephalopathy is considered less likely given the unilaterality. MRI brain with and without contrast is recommended. Also consider obtaining a chest radiograph.  These results were called by telephone at the time of interpretation on 06/17/2014 at 8:12 pm to Dr. Lajean Saver, who verbally acknowledged these results.   Electronically Signed   By: Sherryl Barters M.D.   On: 06/17/2014 20:13   Mr Jeri Cos CN Contrast  06/17/2014   CLINICAL DATA:  Seizure.  Abnormal head CT.  EXAM: MRI HEAD WITHOUT AND WITH CONTRAST  TECHNIQUE: Multiplanar, multiecho pulse sequences of the brain and surrounding structures were obtained without and with intravenous contrast.  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  There is a 14 mm avidly enhancing lesion in the right parieto-occipital region without central necrosis. There is pronounced regional vasogenic edema. There is a second lesion within the cerebellum on the right measuring 4 mm in diameter. There is a third lesion within the inferior cerebellum on the left measuring 5 mm in diameter. There is a 4 mm lesion in the right frontal region. There is a questionable fifth lesion deep to the insula on the left measuring about 4 mm.  No evidence of hydrocephalus.  No extra-axial fluid collection.  There is an extensive lesion centered within the sphenoid bone on the left with enhancing mass extending along the lateral margin and  floor of the middle cranial fossa and into the masticator space. This is presumed to represent an additional site of metastatic disease. Theoretically, this could be a primary lesion from which the brain metastases arose.  IMPRESSION: 4 and possibly 5 brain masses consistent with metastatic disease. The largest lesion was visible at  CT. This represents a 14 mm mass in the right parietal occipital brain with surrounding edema. There are 2 lesions an the cerebellum and a few other scattered within the cerebral hemispheres.  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 This could represent metastatic disease or could be a primary lesion of some sort.   Electronically Signed   By: Nelson Chimes M.D.   On: 06/17/2014 21:47     A/P: 68 y.o. female with  1. Brain Masses Metastatic versus Primary in origin Patient carries a history of tobacco habituation, for lung cancer may may be of a strong possibility. In addition, there is a strong family history of breast cancer which will have to be excluded once biopsy results are available.  A CXR on admission showed  A focal right basilar and right perihilar airspace opacity is compatible with pneumonia, but due to brain lesions, malignancy could not be excluded.  CT of the chest, abdomen and pelvis are currently pending to rule out metastatic disease.  Based on the findings, tissue biopsy will be obtained for definite diagnosis.  Continue Keppra and Decadron as prescribed Radiation Oncology consultation is recommended. Tobacco cessation recomneded. Will follow with you. Once diagnosis is available, further treatment plans and workup labs to proceed.   2. Generalized Seizure Secondary to #1 Continue Keppra and Decadron to prevent further events. Appreciate Neuro follow up  3. DVT prophylaxis On Lovenox  4. Full  Code  Elease Hashimoto 06/18/2014 10:01 AM   Patient seen and examined. I agree with note above.  She is feeling well and reports no new symptoms. No headaches or seizures.   Exam reveled no facial swelling or neurological deficits.  Lung exam was clear without any LAD or masses.   CT scan chest,  abdomina and pelvis was discussed today with the patient along her MRI of the brain.  Right lung mass, LAD and possible adrenal mass noted.  At least 5 brain tumors noted on MRI.  Impression: 68 year old with new onset seizure and new brain tumors.  This is most likely lung cancer with brain mets but we need pathology confirmation. I discussed with her that she will likely need whole brain XRT and chemotherapy after that.   I recommend:   1. IR consult for lung biopsy. I will place a consult today. 2. If IR can do it, please consult pulmonary medicine for bronchoscopy.  3. Radiation Oncology consult: I will call them today. 4. Will arrange an out patient follow up upon her discharge with Medical Oncology. After the biopsy, there is no need to keep her hospitalized if she remains stable.   Thanks for the consult, please call with questions.

## 2014-06-18 NOTE — Progress Notes (Signed)
Dr. Alen Blew called nurse and nurse reported CT results back. He stated pt could eat as biopsy not being preformed until tomorrow. Will resume diet for now.

## 2014-06-18 NOTE — H&P (Signed)
Agree.  Imaging reviewed.  Right lung biopsy is high risk percutaneously.  Will try to proceed tomorrow with CT guided biopsy of left adrenal mass.

## 2014-06-19 ENCOUNTER — Other Ambulatory Visit: Payer: Self-pay | Admitting: Oncology

## 2014-06-19 ENCOUNTER — Inpatient Hospital Stay (HOSPITAL_COMMUNITY): Payer: Medicare HMO

## 2014-06-19 DIAGNOSIS — C7931 Secondary malignant neoplasm of brain: Secondary | ICD-10-CM

## 2014-06-19 DIAGNOSIS — R918 Other nonspecific abnormal finding of lung field: Secondary | ICD-10-CM | POA: Diagnosis present

## 2014-06-19 HISTORY — PX: OTHER SURGICAL HISTORY: SHX169

## 2014-06-19 LAB — BASIC METABOLIC PANEL
ANION GAP: 15 (ref 5–15)
BUN: 12 mg/dL (ref 6–23)
CHLORIDE: 98 meq/L (ref 96–112)
CO2: 23 mEq/L (ref 19–32)
Calcium: 9.1 mg/dL (ref 8.4–10.5)
Creatinine, Ser: 0.72 mg/dL (ref 0.50–1.10)
GFR calc Af Amer: >90 mL/min (ref >90)
GFR calc non Af Amer: 88 mL/min — ABNORMAL LOW (ref >90)
GLUCOSE: 112 mg/dL — AB (ref 70–99)
Potassium: 4 mEq/L (ref 3.7–5.3)
SODIUM: 136 meq/L — AB (ref 137–147)

## 2014-06-19 MED ORDER — LEVETIRACETAM 500 MG PO TABS: 500.0000 mg | ORAL_TABLET | Freq: Two times a day (BID) | ORAL | Status: DC

## 2014-06-19 MED ORDER — BISOPROLOL FUMARATE 5 MG PO TABS: 5.0000 mg | ORAL_TABLET | Freq: Every day | ORAL | Status: DC

## 2014-06-19 MED ORDER — MIDAZOLAM HCL 2 MG/2ML IJ SOLN
INTRAMUSCULAR | Status: AC | PRN
Start: 2014-06-19 — End: 2014-06-19
  Administered 2014-06-19 (×2): 1 mg via INTRAVENOUS

## 2014-06-19 MED ORDER — MIDAZOLAM HCL 2 MG/2ML IJ SOLN
INTRAMUSCULAR | Status: AC
  Filled 2014-06-19: qty 4

## 2014-06-19 MED ORDER — LIDOCAINE HCL 1 % IJ SOLN
INTRAMUSCULAR | Status: AC
  Filled 2014-06-19: qty 10

## 2014-06-19 MED ORDER — DEXAMETHASONE 4 MG PO TABS: 4.0000 mg | ORAL_TABLET | Freq: Two times a day (BID) | ORAL | Status: DC

## 2014-06-19 MED ORDER — FENTANYL CITRATE 0.05 MG/ML IJ SOLN
INTRAMUSCULAR | Status: AC | PRN
  Administered 2014-06-19 (×2): 50 ug via INTRAVENOUS

## 2014-06-19 MED ORDER — FENTANYL CITRATE 0.05 MG/ML IJ SOLN
INTRAMUSCULAR | Status: AC
  Filled 2014-06-19: qty 4

## 2014-06-19 NOTE — Progress Notes (Signed)
[facility] PROGRESS NOTE  Kristina Lawson JAS:[id number] DOB: July 25, 1946 DOA: 06/17/2014 PCP: [name], MD  Assessment/Plan: 1. 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 Suspected Metastatic lung CA -for Adrenal biopsy today per IR -Oncology following  3. HTN -stable, continue bisoprolol, HCTZ stopped due to hyponatremia  4. Tobacco abuse -counseled  DVt proph: lovenox  Code Status: Full Code Family Communication: d/w son at bedside Disposition Plan: home pending workup   Consultants:  Neuro  IR  Oncology  HPI/Subjective: Feels ok, no complaints  Objective: Filed Vitals:   06/19/14 1349  BP: 151/78  Pulse: 83  Temp:   Resp: 14    Intake/Output Summary (Last 24 hours) at 06/19/14 1351 Last data filed at 06/19/14 1200  Gross per 24 hour  Intake    900 ml  Output   2350 ml  Net  -1450 ml   Filed Weights   06/17/14 2350  Weight: 78.7 kg (173 lb 8 oz)    Exam:   General:  AAOx3  Cardiovascular: S1S2/RRR  Respiratory: CTAB  Abdomen: soft, Nt, BS present  Musculoskeletal: no edema c/c  Neuro: RUE weakness  Data Reviewed: Basic Metabolic Panel:  Recent Labs Lab 06/17/14 1841 06/18/14 0253 06/19/14 0339  NA 126* 131* 136*  K 4.3 3.9 4.0  CL 86* 95* 98  CO2 23 22 23   GLUCOSE 145* 151* 112*  BUN 9 7 12   CREATININE 0.74 0.54 0.72  CALCIUM 9.5 9.0 9.1   Liver Function Tests:  Recent Labs Lab 06/18/14 0253  AST 18  ALT 13  ALKPHOS 72  BILITOT 0.6  PROT 7.0  ALBUMIN 3.5   No results found for this basename: LIPASE, AMYLASE,  in the last 168 hours No results found for this basename: AMMONIA,  in the last 168 hours CBC:  Recent Labs Lab 06/17/14 1841 06/18/14 0253  WBC 11.1* 8.3  NEUTROABS  --  7.6  HGB 14.2 13.4  HCT 42.3 40.2  MCV 90.4 90.3  PLT 299 310   Cardiac Enzymes: No results  found for this basename: CKTOTAL, CKMB, CKMBINDEX, TROPONINI,  in the last 168 hours BNP (last 3 results) No results found for this basename: PROBNP,  in the last 8760 hours CBG:  Recent Labs Lab 06/17/14 1910  GLUCAP 133*    Recent Results (from the past 240 hour(s))  MRSA PCR SCREENING     Status: None   Collection Time    06/17/14 11:39 PM      Result Value Ref Range Status   MRSA by PCR NEGATIVE  NEGATIVE Final   Comment:            The GeneXpert MRSA Assay (FDA     approved for NASAL specimens     only), is one component of a     comprehensive MRSA colonization     surveillance program. It is not     intended to diagnose MRSA     infection nor to guide or     monitor treatment for     MRSA infections.     Studies: Dg Chest 2 View  06/17/2014   CLINICAL DATA:  Seizure.  EXAM: CHEST  2 VIEW  COMPARISON:  None  FINDINGS: The lungs are well-aerated. Focal right basilar airspace opacity is compatible with pneumonia, with additional right perihilar opacity seen. Mild vascular congestion is noted. No pleural effusion or  pneumothorax is identified.  The heart is mildly enlarged. No acute osseous abnormalities are seen.  IMPRESSION: 1. Focal right basilar and right perihilar airspace opacity is compatible with pneumonia. Would treat for pneumonia, then perform follow-up chest radiograph to ensure resolution of airspace opacity. If the patient has no symptoms for pneumonia, would correlate to exclude aspiration. Mass cannot be excluded, but is considered less likely given the appearance of the opacity. 2. Mild vascular congestion and mild cardiomegaly noted.   Electronically Signed   By: Garald Balding M.D.   On: 06/17/2014 22:13   Ct Head Wo Contrast   (if New Onset Seizure And/or Head Trauma)  06/17/2014   CLINICAL DATA:  Seizures  EXAM: CT HEAD WITHOUT CONTRAST  TECHNIQUE: Contiguous axial images were obtained from the base of the skull through the vertex without intravenous contrast.   COMPARISON:  None.  FINDINGS: Abnormal white matter hypodensity in the right occipital lobe and right posterior parietal lobe is asymmetric. Although the possibility of some cortical involvement is raised, this primarily appears to involve the white matter and is accordingly suspicious for vasogenic edema. Right upper parietal gyri appear asymmetric on image 25 of series 2.  The brainstem, cerebellum, basal ganglia, ventricular system, and basilar cisterns appear otherwise unremarkable. There is atherosclerotic calcification of the cavernous carotid arteries bilaterally.  IMPRESSION: 1. Asymmetric abnormal white matter hypodensity in the right occipital lobe and right parietal lobe posteriorly. This primarily resembles vasogenic edema and raises the possibility of underlying tumor, encephalitis, demyelinating process, or less likely an unusual ischemic process. Posterior reversible encephalopathy is considered less likely given the unilaterality. MRI brain with and without contrast is recommended. Also consider obtaining a chest radiograph.  These results were called by telephone at the time of interpretation on 06/17/2014 at 8:12 pm to Dr. Lajean Saver, who verbally acknowledged these results.   Electronically Signed   By: Sherryl Barters M.D.   On: 06/17/2014 20:13   Ct Chest W Contrast  06/18/2014   CLINICAL DATA:  Known intracranial metastatic disease  EXAM: CT CHEST, ABDOMEN, AND PELVIS WITH CONTRAST  TECHNIQUE: Multidetector CT imaging of the chest, abdomen and pelvis was performed following the standard protocol during bolus administration of intravenous contrast.  CONTRAST:  118mL OMNIPAQUE IOHEXOL 300 MG/ML  SOLN  COMPARISON:  Chest x-ray from the previous day  FINDINGS: CT CHEST FINDINGS  The left lung is well aerated. A 9 mm nodule is noted within the left lateral costophrenic angle. The right lung is well aerated but demonstrates evidence of a the lobular mass lesion which measures at least 2.6 x 2.3  cm within the right upper lobe posteriorly. Some associated atelectatic changes are noted. Additionally there is significant sub carinal and right hilar and right paratracheal lymphadenopathy. The right paratracheal adenopathy measures 2.8 x 2.3 cm. The subcarinal and right hilar adenopathy is confluent and measures at least 7.0 by 5.6 cm. It encases the superior vena cava and right pulmonary artery and causes some stenosis but no obstruction is seen. No thrombus is noted. Coronary calcifications are seen.  CT ABDOMEN AND PELVIS FINDINGS  The liver, gallbladder, spleen and right adrenal gland are within normal limits. The pancreas is unremarkable. The left adrenal gland demonstrates a 3.4 x 3.2 cm mass lesion consistent with metastatic disease. Kidneys are well visualized and demonstrate evidence of portion to deformity with an apparent central fibrous band. No renal calculi or obstructive changes are noted. Renal vascular calcifications are seen. Diffuse aortic calcifications are  noted without aneurysmal dilatation.  The appendix is within normal limits. Mild diverticular change is noted without evidence of diverticulitis. The bladder is well distended. The uterus show some scattered calcifications likely related uterine fibroid disease. No definitive bony metastatic disease is noted.  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 Vascular encasement without occlusion or thrombosis is noted.  These results will be called to the ordering clinician or representative by the Radiologist Assistant, and communication documented in the PACS or zVision Dashboard.   Electronically Signed   By: Inez Catalina M.D.   On: 06/18/2014 13:54   Mr Jeri Cos OV Contrast  06/17/2014   CLINICAL DATA:  Seizure.  Abnormal head CT.  EXAM: MRI HEAD  WITHOUT AND WITH CONTRAST  TECHNIQUE: Multiplanar, multiecho pulse sequences of the brain and surrounding structures were obtained without and with intravenous contrast.  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  There is a 14 mm avidly enhancing lesion in the right parieto-occipital region without central necrosis. There is pronounced regional vasogenic edema. There is a second lesion within the cerebellum on the right measuring 4 mm in diameter. There is a third lesion within the inferior cerebellum on the left measuring 5 mm in diameter. There is a 4 mm lesion in the right frontal region. There is a questionable fifth lesion deep to the insula on the left measuring about 4 mm.  No evidence of hydrocephalus.  No extra-axial fluid collection.  There is an extensive lesion centered within the sphenoid bone on the left with enhancing mass extending along the lateral margin and floor of the middle cranial fossa and into the masticator space. This is presumed to represent an additional site of metastatic disease. Theoretically, this could be a primary lesion from which the brain metastases arose.  IMPRESSION: 4 and possibly 5 brain masses consistent with metastatic disease. The largest lesion was visible at CT. This represents a 14 mm mass in the right parietal occipital brain with surrounding edema. There are 2 lesions an the cerebellum and a few other scattered within the cerebral hemispheres.  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 This could represent metastatic disease or could be a primary lesion of some sort.   Electronically Signed   By: Nelson Chimes M.D.   On: 06/17/2014 21:47   Ct Abdomen Pelvis W Contrast  06/18/2014    CLINICAL DATA:  Known intracranial metastatic disease  EXAM: CT CHEST, ABDOMEN, AND PELVIS WITH CONTRAST  TECHNIQUE: Multidetector CT imaging of the chest, abdomen and pelvis was performed following the standard protocol during bolus administration of intravenous contrast.  CONTRAST:  136mL OMNIPAQUE IOHEXOL 300 MG/ML  SOLN  COMPARISON:  Chest x-ray from the previous day  FINDINGS: CT CHEST FINDINGS  The left lung is well aerated. A 9 mm nodule is noted within the left lateral costophrenic angle. The right lung is well aerated but demonstrates evidence of a the lobular mass lesion which measures at least 2.6 x 2.3 cm within the right upper lobe posteriorly. Some associated  atelectatic changes are noted. Additionally there is significant sub carinal and right hilar and right paratracheal lymphadenopathy. The right paratracheal adenopathy measures 2.8 x 2.3 cm. The subcarinal and right hilar adenopathy is confluent and measures at least 7.0 by 5.6 cm. It encases the superior vena cava and right pulmonary artery and causes some stenosis but no obstruction is seen. No thrombus is noted. Coronary calcifications are seen.  CT ABDOMEN AND PELVIS FINDINGS  The liver, gallbladder, spleen and right adrenal gland are within normal limits. The pancreas is unremarkable. The left adrenal gland demonstrates a 3.4 x 3.2 cm mass lesion consistent with metastatic disease. Kidneys are well visualized and demonstrate evidence of portion to deformity with an apparent central fibrous band. No renal calculi or obstructive changes are noted. Renal vascular calcifications are seen. Diffuse aortic calcifications are noted without aneurysmal dilatation.  The appendix is within normal limits. Mild diverticular change is noted without evidence of diverticulitis. The bladder is well distended. The uterus show some scattered calcifications likely related uterine fibroid disease. No definitive bony metastatic disease is noted.  IMPRESSION:  Constellation of findings consistent with carcinoma of the lung with metastatic disease to the left adrenal gland, hilar and mediastinal lymph nodes as well as the possibility of a metastatic lesion within the left lung base. Vascular encasement without occlusion or thrombosis is noted.  These results will be called to the ordering clinician or representative by the Radiologist Assistant, and communication documented in the PACS or zVision Dashboard.   Electronically Signed   By: Inez Catalina M.D.   On: 06/18/2014 13:54   Dg Humerus Right  06/18/2014   CLINICAL DATA:  Right arm pain without history of trauma  EXAM: RIGHT HUMERUS - 2+ VIEW  COMPARISON:  None.  FINDINGS: The humerus is adequately mineralized. No acute fracture or dislocation is demonstrated. Portions of the upper aspect of the shaft are obscured 2 of the images. The observed portions of the glenohumeral joint and of the elbow exhibit no acute abnormalities. The soft tissues of the arm are unremarkable.  IMPRESSION: There is no acute bony abnormality of the right humerus.   Electronically Signed   By: David  Martinique   On: 06/18/2014 18:20    Scheduled Meds: . bisoprolol  5 mg Oral Daily  . dexamethasone  4 mg Intravenous 4 times per day  . enoxaparin (LOVENOX) injection  40 mg Subcutaneous Daily  . fentaNYL      . levETIRAcetam  500 mg Intravenous Q12H  . levofloxacin (LEVAQUIN) IV  750 mg Intravenous QHS  . lidocaine      . midazolam      . omega-3 acid ethyl esters  1,000 mg Oral Daily   Continuous Infusions:  Antibiotics Given (last 72 hours)   Date/Time Action Medication Dose Rate   06/18/14 0140 Given   levofloxacin (LEVAQUIN) IVPB 750 mg 750 mg 100 mL/hr   06/18/14 2200 Given   levofloxacin (LEVAQUIN) IVPB 750 mg 750 mg 100 mL/hr      Principal Problem:   Generalized seizure Active Problems:   Brain metastasis   Brain metastases   HTN (hypertension)   Seizures    Time spent: 44min    Cristiano Capri  [facility] Pager [phone]. If 7PM-7AM, please contact night-coverage at [email or web address], password [facility] 06/19/2014, 1:51 PM  LOS: 2 days

## 2014-06-19 NOTE — Sedation Documentation (Signed)
Patient is resting comfortably. 

## 2014-06-19 NOTE — Progress Notes (Signed)
Utilization Review Completed.  

## 2014-06-19 NOTE — Sedation Documentation (Signed)
Pt came down on 022L- will transport back on 2L/[place]

## 2014-06-19 NOTE — Procedures (Signed)
Procedure:  CT guided core biopsy of left adrenal mass Findings:  17 G needle advanced from posterior approach.  18 G core biopsy x 2 yielding solid tissue. No immediate complications.

## 2014-06-19 NOTE — Progress Notes (Signed)
Pt back from Biopsy, pt is stable and back on monitor. IV access lost during procedure, MD notified and questioned to restart IV d/t possible discharge this afternoon.   Trevionne Advani M. Dalbert Batman, RN, BSN 06/19/2014 2:44 PM

## 2014-06-19 NOTE — Progress Notes (Signed)
Pt discharged via wheelchair. Son with pt when discharged. Discharged instructions given and new medications explained to pt. Pts site from biopsy was dry with no blood.  Larkin Morelos M. Dalbert Batman, RN, BSN 06/19/2014 4:25 PM

## 2014-06-19 NOTE — Discharge Summary (Signed)
Physician Discharge Summary  Kristina Lawson QIO:[id number] DOB: June 26, 1946 DOA: 06/17/2014  PCP: [name], MD  Admit date: 06/17/2014 Discharge date: 06/19/2014  Time spent: 45 minutes  Recommendations for Outpatient Follow-up:  1. Dr.Shadad 8/19 at Cancer center 2. Radiation Oncology 8/20 at 10:30am 3. Instructed not to drive or operate machinery till cleared by PCP  Discharge Diagnoses:  Principal Problem:   Generalized seizure Active Problems:   Brain metastasis   HTN (hypertension)   Seizures   Lung mass   Discharge Condition: stable  Diet recommendation: heart healthy  Filed Weights   06/17/14 2350  Weight: 78.7 kg (173 lb 8 oz)    History of present illness:  Kristina Lawson is a 68 y.o. female with history of hypertension and hyperlipidemia was brought to the ER after patient had an episode of seizures. In the ER patient initially had CT head followed by MRI brain which showed - "4 and possibly 5 brain masses consistent with metastatic disease. The largest lesion was visible at CT. This represents a 14 mm mass in the right parietal occipital brain with surrounding edema. There are 2 lesions an the cerebellum and a few other scattered within the cerebral hemispheres. 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 This could represent metastatic disease or could be a primary lesion of some sort.". Neurologist on-call Dr. Nicole Kindred was consulted patient was placed on Decadron and Keppra and admitted for further workup.    Hospital Course:  1. GTC seizures  -due to brain mets noted on MRI -Seen by Neurology, started on Keppra and decadron  -seizure free and stable now -FU with Rad Onc appt made for 8/20, seen by Oncology Dr.SHadad, completed adrenal biopsy today  2. Suspected Metastatic lung CA  -lung mass noted on CT -s/p Adrenal  biopsy today per IR  -Oncology FU on 8/19  3. HTN  -stable, continue bisoprolol, HCTZ stopped due to hyponatremia   4. Tobacco abuse  -counseled   Discharge Exam: Filed Vitals:   06/19/14 1349  BP: 151/78  Pulse: 83  Temp:   Resp: 14    General: AAOx3 Cardiovascular: S1S2/RRR Respiratory: CTAB  Discharge Instructions You were cared for by a hospitalist during your hospital stay. If you have any questions about your discharge medications or the care you received while you were in the hospital after you are discharged, you can call the unit and asked to speak with the hospitalist on call if the hospitalist that took care of you is not available. Once you are discharged, your primary care physician will handle any further medical issues. Please note that NO REFILLS for any discharge medications will be authorized once you are discharged, as it is imperative that you return to your primary care physician (or establish a relationship with a primary care physician if you do not have one) for your aftercare needs so that they can reassess your need for medications and monitor your lab values.  Discharge Instructions   Diet - low sodium heart healthy    Complete by:  As directed      Discharge instructions    Complete by:  As directed   Do not drive operate machinery or participate in any underwater activities till seizure free for atleast 6 months or till cleared by PCP     Increase activity slowly    Complete by:  As directed             Medication List    STOP taking these medications       bisoprolol-hydrochlorothiazide 5-6.25 MG per tablet  Commonly known as:  ZIAC     penicillin v potassium 500 MG tablet  Commonly known as:  VEETID      TAKE these medications       bisoprolol 5 MG tablet  Commonly known as:  ZEBETA  Take 1 tablet (5 mg total) by mouth daily.     dexamethasone 4 MG tablet  Commonly known as:  DECADRON  Take 1 tablet (4 mg total) by mouth 2 (two)  times daily with a meal.     Fish Oil 1200 MG Caps  Take 1,200 mg by mouth daily.     levETIRAcetam 500 MG tablet  Commonly known as:  KEPPRA  Take 1 tablet (500 mg total) by mouth 2 (two) times daily.     Vitamin D 2000 UNITS tablet  Take 2,000 Units by mouth daily.       No Active Allergies     Follow-up Information   Follow up with [facility], [place] On 06/26/2014. (at 1pm)    Specialty:  Oncology   Contact information:   [address] [place] [address] [phone]       Follow up with [facility] Radiation Oncology On 06/27/2014. (at 10:30am)    Specialty:  Radiation Oncology   Contact information:   [place] [id number] [place] [address] [phone]       The results of significant diagnostics from this hospitalization (including imaging, microbiology, ancillary and laboratory) are listed below for reference.    Significant Diagnostic Studies: Dg Chest 2 View  06/17/2014   CLINICAL DATA:  Seizure.  EXAM: CHEST  2 VIEW  COMPARISON:  None  FINDINGS: The lungs are well-aerated. Focal right basilar airspace opacity is compatible with pneumonia, with additional right perihilar opacity seen. Mild vascular congestion is noted. No pleural effusion or pneumothorax is identified.  The heart is mildly enlarged. No acute osseous abnormalities are seen.  IMPRESSION: 1. Focal right basilar and right perihilar airspace opacity is compatible with pneumonia. Would treat for pneumonia, then perform follow-up chest radiograph to ensure resolution of airspace opacity. If the patient has no symptoms for pneumonia, would correlate to exclude aspiration. Mass cannot be excluded, but is considered less likely given the appearance of the opacity. 2. Mild vascular congestion and mild cardiomegaly noted.   Electronically Signed   By: Garald Balding M.D.   On: 06/17/2014 22:13   Ct Head Wo Contrast   (if New Onset Seizure And/or Head Trauma)  06/17/2014    CLINICAL DATA:  Seizures  EXAM: CT HEAD WITHOUT CONTRAST  TECHNIQUE: Contiguous axial images were obtained from the base of the skull through the vertex without intravenous contrast.  COMPARISON:  None.  FINDINGS: Abnormal white matter hypodensity in the right occipital lobe and right posterior parietal lobe is asymmetric. Although the possibility of some cortical involvement is raised, this primarily appears to involve the white matter and is accordingly suspicious for vasogenic edema. Right upper parietal gyri appear asymmetric on image 25 of series 2.  The brainstem, cerebellum, basal ganglia, ventricular system, and basilar cisterns appear otherwise unremarkable. There is atherosclerotic calcification of the cavernous carotid arteries bilaterally.  IMPRESSION: 1. Asymmetric abnormal white matter hypodensity in the right occipital lobe and right parietal lobe posteriorly. This primarily resembles vasogenic  edema and raises the possibility of underlying tumor, encephalitis, demyelinating process, or less likely an unusual ischemic process. Posterior reversible encephalopathy is considered less likely given the unilaterality. MRI brain with and without contrast is recommended. Also consider obtaining a chest radiograph.  These results were called by telephone at the time of interpretation on 06/17/2014 at 8:12 pm to Dr. Lajean Saver, who verbally acknowledged these results.   Electronically Signed   By: Sherryl Barters M.D.   On: 06/17/2014 20:13   Ct Chest W Contrast  06/18/2014   CLINICAL DATA:  Known intracranial metastatic disease  EXAM: CT CHEST, ABDOMEN, AND PELVIS WITH CONTRAST  TECHNIQUE: Multidetector CT imaging of the chest, abdomen and pelvis was performed following the standard protocol during bolus administration of intravenous contrast.  CONTRAST:  12mL OMNIPAQUE IOHEXOL 300 MG/ML  SOLN  COMPARISON:  Chest x-ray from the previous day  FINDINGS: CT CHEST FINDINGS  The left lung is well aerated. A 9  mm nodule is noted within the left lateral costophrenic angle. The right lung is well aerated but demonstrates evidence of a the lobular mass lesion which measures at least 2.6 x 2.3 cm within the right upper lobe posteriorly. Some associated atelectatic changes are noted. Additionally there is significant sub carinal and right hilar and right paratracheal lymphadenopathy. The right paratracheal adenopathy measures 2.8 x 2.3 cm. The subcarinal and right hilar adenopathy is confluent and measures at least 7.0 by 5.6 cm. It encases the superior vena cava and right pulmonary artery and causes some stenosis but no obstruction is seen. No thrombus is noted. Coronary calcifications are seen.  CT ABDOMEN AND PELVIS FINDINGS  The liver, gallbladder, spleen and right adrenal gland are within normal limits. The pancreas is unremarkable. The left adrenal gland demonstrates a 3.4 x 3.2 cm mass lesion consistent with metastatic disease. Kidneys are well visualized and demonstrate evidence of portion to deformity with an apparent central fibrous band. No renal calculi or obstructive changes are noted. Renal vascular calcifications are seen. Diffuse aortic calcifications are noted without aneurysmal dilatation.  The appendix is within normal limits. Mild diverticular change is noted without evidence of diverticulitis. The bladder is well distended. The uterus show some scattered calcifications likely related uterine fibroid disease. No definitive bony metastatic disease is noted.  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 Vascular encasement without occlusion or thrombosis is noted.  These results will be called to the ordering clinician or representative by the Radiologist Assistant, and communication documented in the PACS or zVision  Dashboard.   Electronically Signed   By: Inez Catalina M.D.   On: 06/18/2014 13:54   Mr Jeri Cos WN Contrast  06/17/2014   CLINICAL DATA:  Seizure.  Abnormal head CT.  EXAM: MRI HEAD WITHOUT AND WITH CONTRAST  TECHNIQUE: Multiplanar, multiecho pulse sequences of the brain and surrounding structures were obtained without and with intravenous contrast.  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  There is a 14 mm avidly enhancing lesion in the right parieto-occipital region without central necrosis. There is pronounced regional vasogenic edema. There is a second lesion within the cerebellum on the right measuring 4 mm in diameter. There  is a third lesion within the inferior cerebellum on the left measuring 5 mm in diameter. There is a 4 mm lesion in the right frontal region. There is a questionable fifth lesion deep to the insula on the left measuring about 4 mm.  No evidence of hydrocephalus.  No extra-axial fluid collection.  There is an extensive lesion centered within the sphenoid bone on the left with enhancing mass extending along the lateral margin and floor of the middle cranial fossa and into the masticator space. This is presumed to represent an additional site of metastatic disease. Theoretically, this could be a primary lesion from which the brain metastases arose.  IMPRESSION: 4 and possibly 5 brain masses consistent with metastatic disease. The largest lesion was visible at CT. This represents a 14 mm mass in the right parietal occipital brain with surrounding edema. There are 2 lesions an the cerebellum and a few other scattered within the cerebral hemispheres.  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 This could  represent metastatic disease or could be a primary lesion of some sort.   Electronically Signed   By: Nelson Chimes M.D.   On: 06/17/2014 21:47   Ct Abdomen Pelvis W Contrast  06/18/2014   CLINICAL DATA:  Known intracranial metastatic disease  EXAM: CT CHEST, ABDOMEN, AND PELVIS WITH CONTRAST  TECHNIQUE: Multidetector CT imaging of the chest, abdomen and pelvis was performed following the standard protocol during bolus administration of intravenous contrast.  CONTRAST:  142mL OMNIPAQUE IOHEXOL 300 MG/ML  SOLN  COMPARISON:  Chest x-ray from the previous day  FINDINGS: CT CHEST FINDINGS  The left lung is well aerated. A 9 mm nodule is noted within the left lateral costophrenic angle. The right lung is well aerated but demonstrates evidence of a the lobular mass lesion which measures at least 2.6 x 2.3 cm within the right upper lobe posteriorly. Some associated atelectatic changes are noted. Additionally there is significant sub carinal and right hilar and right paratracheal lymphadenopathy. The right paratracheal adenopathy measures 2.8 x 2.3 cm. The subcarinal and right hilar adenopathy is confluent and measures at least 7.0 by 5.6 cm. It encases the superior vena cava and right pulmonary artery and causes some stenosis but no obstruction is seen. No thrombus is noted. Coronary calcifications are seen.  CT ABDOMEN AND PELVIS FINDINGS  The liver, gallbladder, spleen and right adrenal gland are within normal limits. The pancreas is unremarkable. The left adrenal gland demonstrates a 3.4 x 3.2 cm mass lesion consistent with metastatic disease. Kidneys are well visualized and demonstrate evidence of portion to deformity with an apparent central fibrous band. No renal calculi or obstructive changes are noted. Renal vascular calcifications are seen. Diffuse aortic calcifications are noted without aneurysmal dilatation.  The appendix is within normal limits. Mild diverticular change is noted without evidence of  diverticulitis. The bladder is well distended. The uterus show some scattered calcifications likely related uterine fibroid disease. No definitive bony metastatic disease is noted.  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 Vascular encasement without occlusion or thrombosis is noted.  These results will be called to the ordering clinician or representative by the Radiologist Assistant, and communication  documented in the PACS or zVision Dashboard.   Electronically Signed   By: Inez Catalina M.D.   On: 06/18/2014 13:54   Dg Humerus Right  06/18/2014   CLINICAL DATA:  Right arm pain without history of trauma  EXAM: RIGHT HUMERUS - 2+ VIEW  COMPARISON:  None.  FINDINGS: The humerus is adequately mineralized. No acute fracture or dislocation is demonstrated. Portions of the upper aspect of the shaft are obscured 2 of the images. The observed portions of the glenohumeral joint and of the elbow exhibit no acute abnormalities. The soft tissues of the arm are unremarkable.  IMPRESSION: There is no acute bony abnormality of the right humerus.   Electronically Signed   By: David  Martinique   On: 06/18/2014 18:20    Microbiology: Recent Results (from the past 240 hour(s))  MRSA PCR SCREENING     Status: None   Collection Time    06/17/14 11:39 PM      Result Value Ref Range Status   MRSA by PCR NEGATIVE  NEGATIVE Final   Comment:            The GeneXpert MRSA Assay (FDA     approved for NASAL specimens     only), is one component of a     comprehensive MRSA colonization     surveillance program. It is not     intended to diagnose MRSA     infection nor to guide or     monitor treatment for     MRSA infections.     Labs: Basic Metabolic Panel:  Recent Labs Lab 06/17/14 1841 06/18/14 0253 06/19/14 0339  NA  126* 131* 136*  K 4.3 3.9 4.0  CL 86* 95* 98  CO2 23 22 23   GLUCOSE 145* 151* 112*  BUN 9 7 12   CREATININE 0.74 0.54 0.72  CALCIUM 9.5 9.0 9.1   Liver Function Tests:  Recent Labs Lab 06/18/14 0253  AST 18  ALT 13  ALKPHOS 72  BILITOT 0.6  PROT 7.0  ALBUMIN 3.5   No results found for this basename: LIPASE, AMYLASE,  in the last 168 hours No results found for this basename: AMMONIA,  in the last 168 hours CBC:  Recent Labs Lab 06/17/14 1841 06/18/14 0253  WBC 11.1* 8.3  NEUTROABS  --  7.6  HGB 14.2 13.4  HCT 42.3 40.2  MCV 90.4 90.3  PLT 299 310   Cardiac Enzymes: No results found for this basename: CKTOTAL, CKMB, CKMBINDEX, TROPONINI,  in the last 168 hours BNP: BNP (last 3 results) No results found for this basename: PROBNP,  in the last 8760 hours CBG:  Recent Labs Lab 06/17/14 1910  GLUCAP 133*       Signed:  Annikah Lovins  [facility] 06/19/2014, 2:47 PM

## 2014-06-20 ENCOUNTER — Encounter: Payer: Self-pay | Admitting: Radiation Oncology

## 2014-06-20 NOTE — Progress Notes (Addendum)
Location/Histology of Brain Tumor: Right parietal occipital,85mm mass;, 2 lesions cerebellum, and a few scattered in cerebral hemispheres, large mass surrounding involving sphenoid bone on the left with tumor growing into lateral aspect and floor of the middle cranial fossa and into soft tissues  Patient presented with symptoms of:  Seizures, brought to the ER  Past or anticipated interventions, if any, per neurosurgery:, MRI& CT Head done,  Dr. Nicole Kindred on call Neurosurgeon  Consulted and placed patient on Dexamethsone and Keppra,   Past or anticipated interventions, if any, per medical oncology: Dr. Lequita Halt 06/18/14  By Dr. Alen Blew in hospital, , follow up 06/25/14   Dose of Decadron, if applicable:yes, 4 mg tab bid, Recent neurologic symptoms, if any:  no  Seizures: yes witnessed,at home, sent to ED 06/18/14   Headaches: no  Nausea: no  Dizziness/ataxia: no  Difficulty with hand coordination: cannot lift right arm ,   Focal numbness/weakness: right arm  Visual deficits/changes: no  Confusion/Memory deficits:  Painful bone metastases at present, if any:  Right shoulder hard to lift arm, pain 4/10   SAFETY ISSUES: yes,   Prior radiation? NO  Pacemaker/ICD?   Possible current pregnancy? No  Is the patient on methotrexate? 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

## 2014-06-24 ENCOUNTER — Ambulatory Visit
Admission: RE | Admit: 2014-06-24 | Discharge: 2014-06-24 | Disposition: A | Discharge status: Home / Self Care | Payer: Medicare HMO | Source: Ambulatory Visit | Attending: Radiation Oncology | Admitting: Radiation Oncology

## 2014-06-24 ENCOUNTER — Encounter: Payer: Self-pay | Admitting: Radiation Oncology

## 2014-06-24 ENCOUNTER — Telehealth: Payer: Self-pay | Admitting: *Deleted

## 2014-06-24 ENCOUNTER — Other Ambulatory Visit: Payer: Self-pay | Admitting: Radiation Therapy

## 2014-06-24 VITALS — BP 120/59 | HR 74 | Temp 97.4°F | Resp 20 | Ht 63.0 in | Wt 169.9 lb

## 2014-06-24 DIAGNOSIS — C7949 Secondary malignant neoplasm of other parts of nervous system: Principal | ICD-10-CM

## 2014-06-24 DIAGNOSIS — C7931 Secondary malignant neoplasm of brain: Secondary | ICD-10-CM | POA: Diagnosis not present

## 2014-06-24 DIAGNOSIS — R222 Localized swelling, mass and lump, trunk: Secondary | ICD-10-CM | POA: Diagnosis present

## 2014-06-24 HISTORY — DX: Malignant neoplasm of brain, unspecified: C71.9

## 2014-06-24 HISTORY — DX: Malignant neoplasm of unspecified part of unspecified bronchus or lung: C34.90

## 2014-06-24 HISTORY — DX: Unspecified convulsions: R56.9

## 2014-06-24 NOTE — Progress Notes (Signed)
Please see the Nurse Progress Note in the MD Initial Consult Encounter for this patient. 

## 2014-06-24 NOTE — Telephone Encounter (Signed)
Called and left voice  message  For Social worker on call Johnnye Lana, pateint's screening distress was a 7, pain and adjusting to her illness, can call her, this was initial consult for Brain mets 10:12 AM

## 2014-06-25 ENCOUNTER — Other Ambulatory Visit: Payer: BC Managed Care – PPO

## 2014-06-25 ENCOUNTER — Ambulatory Visit: Payer: Medicare HMO

## 2014-06-25 ENCOUNTER — Ambulatory Visit (HOSPITAL_BASED_OUTPATIENT_CLINIC_OR_DEPARTMENT_OTHER): Payer: Medicare HMO | Admitting: Oncology

## 2014-06-25 ENCOUNTER — Ambulatory Visit
Admission: RE | Admit: 2014-06-25 | Discharge: 2014-06-25 | Disposition: A | Discharge status: Home / Self Care | Payer: Medicare HMO | Source: Ambulatory Visit | Attending: Radiation Oncology | Admitting: Radiation Oncology

## 2014-06-25 ENCOUNTER — Encounter: Payer: Self-pay | Admitting: Oncology

## 2014-06-25 ENCOUNTER — Telehealth: Payer: Self-pay | Admitting: Oncology

## 2014-06-25 VITALS — BP 159/53 | HR 70 | Temp 98.0°F | Resp 18 | Ht 63.0 in | Wt 169.5 lb

## 2014-06-25 DIAGNOSIS — M7989 Other specified soft tissue disorders: Secondary | ICD-10-CM

## 2014-06-25 DIAGNOSIS — C7931 Secondary malignant neoplasm of brain: Secondary | ICD-10-CM

## 2014-06-25 DIAGNOSIS — R918 Other nonspecific abnormal finding of lung field: Secondary | ICD-10-CM

## 2014-06-25 DIAGNOSIS — C7949 Secondary malignant neoplasm of other parts of nervous system: Principal | ICD-10-CM

## 2014-06-25 DIAGNOSIS — R222 Localized swelling, mass and lump, trunk: Secondary | ICD-10-CM

## 2014-06-25 MED ORDER — GADOBENATE DIMEGLUMINE 529 MG/ML IV SOLN: 15.0000 mL | Freq: Once | INTRAVENOUS | Status: AC | PRN

## 2014-06-25 NOTE — Telephone Encounter (Signed)
gv and printed appt sched adn avs for pt for Aug and SEpt...Marland KitchenMarland Kitchen

## 2014-06-25 NOTE — Progress Notes (Signed)
Hematology and Oncology Follow Up Visit  Kristina Lawson [id number] 10-05-1946 68 y.o. 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 She presented with seizure and left sided weakness. In August of 2015. She underwent staging studies which showed a mass in the right lung measuring 2.6 x 2.3 cm in the right upper lobe as well as lymphadenopathy.    Prior Therapy: She is status post adrenal biopsy that was nondiagnostic on 06/19/2014.  Current therapy: She is under evaluation for start of radiation therapy to the brain  Interim History:  Kristina Lawson today for a followup visit. She is a pleasant woman I saw in consultation on 06/18/2014 for possible stage IV lung cancer. She presented with brain lesions and seizures and was started on antiseizure medication and dexamethasone. Since her discharge she has been doing very well without any further seizure activity. She has not reported any headaches or blurry vision did not report any syncope. She does report right upper extremity weakness but is improving slowly. She continues to be on dexamethasone and Keppra. She does report any cough or shortness of breath. Desirable any wheezing or hemoptysis. She does not report any chest pain or palpitation. Desirable any nausea or vomiting or abdominal pain. She does not report any hematochezia or melena. Does not report any urinary complaints. Rest of her review of systems unremarkable.   Medications: I have reviewed the patient's current medications.  Current Outpatient Prescriptions  Medication Sig Dispense Refill  . bisoprolol (ZEBETA) 5 MG tablet Take 1 tablet (5 mg total) by mouth daily.  30 tablet  0  . Cholecalciferol (VITAMIN D) 2000 UNITS tablet Take 2,000 Units by mouth daily.      Marland Kitchen dexamethasone (DECADRON) 4 MG tablet Take 1 tablet (4 mg total) by mouth 2 (two) times  daily with a meal.  60 tablet  0  . levETIRAcetam (KEPPRA) 500 MG tablet Take 1 tablet (500 mg total) by mouth 2 (two) times daily.  60 tablet  0  . Omega-3 Fatty Acids (FISH OIL) 1200 MG CAPS Take 1,200 mg by mouth daily.        No current facility-administered medications for this visit.   Facility-Administered Medications Ordered in Other Visits  Medication Dose Route Frequency Provider Last Rate Last Dose  . gadobenate dimeglumine (MULTIHANCE) injection 15 mL  15 mL Intravenous Once PRN Medication Radiologist, MD         Allergies: No Known Allergies  Past Medical History, Surgical history, Social history, and Family History were reviewed and updated.   Physical Exam: Blood pressure 159/53, pulse 70, temperature 98 F (36.7 C), temperature source Oral, resp. rate 18, height 5\' 3"  (1.6 m), weight 169 lb 8 oz (76.885 kg), SpO2 97.00%. ECOG: 0 General appearance: alert and cooperative Head: Normocephalic, without obvious abnormality Neck: no adenopathy Lymph nodes: Cervical, supraclavicular, and axillary nodes normal. Heart:regular rate and rhythm, S1, S2 normal, no murmur, click, rub or gallop Lung:chest clear, no wheezing, rales, normal symmetric air entry Abdomin: soft, non-tender, without masses or organomegaly EXT:no erythema, induration, or nodules   Lab Results: Lab Results  Component Value Date   WBC 8.3 06/18/2014   HGB 13.4 06/18/2014   HCT 40.2 06/18/2014   MCV 90.3 06/18/2014   PLT 310 06/18/2014     Chemistry      Component Value Date/Time   NA 136* 06/19/2014 0339   K  4.0 06/19/2014 0339   CL 98 06/19/2014 0339   CO2 23 06/19/2014 0339   BUN 12 06/19/2014 0339   CREATININE 0.72 06/19/2014 0339   CREATININE 0.79 02/06/2014 0908      Component Value Date/Time   CALCIUM 9.1 06/19/2014 0339   ALKPHOS 72 06/18/2014 0253   AST 18 06/18/2014 0253   ALT 13 06/18/2014 0253   BILITOT 0.6 06/18/2014 0253       Radiological Studies: Mr Jeri Cos Wo Contrast  07/09/2014    CLINICAL DATA:  SRS targeting. History of seizure. Suspected lung cancer.  EXAM: MRI HEAD WITHOUT AND WITH CONTRAST  TECHNIQUE: Multiplanar, multiecho pulse sequences of the brain and surrounding structures were obtained without and with intravenous contrast.  CONTRAST:  MultiHance 15 mL.  COMPARISON:  CT head 06/17/2014. MR head 06/17/2014 performed at [facility].  FINDINGS: Multiple intracranial metastatic deposits are redemonstrated. No additional lesions are seen in comparison with the prior 3T MR from [facility] [place]. Lesions are listed from inferior to superior, with referenced axial series 10 postcontrast, as follows:  - 7 x 5 mm, image 29, LEFT inferior cerebellum.  - 5 x 7 mm, image 38, RIGHT inferior cerebellum.  - 13 x 12 by 7 mm, image 59, LEFT anterior and inferior temporal lobe. Dural thickening along the lateral margin of the middle cranial fossa is associated with this lesion, which could be intra-axial and extra-axial. The dural enhancement on the LEFT extends toward the convexity over a craniocaudal extent of roughly 6 cm.  - 4 mm, image 84, LEFT external capsule.  - 13 x 14 x 13 mm, image 85, RIGHT occipital lobe. This last lesion is the dominant abnormality and is associated with moderate vasogenic edema.  Redemonstrated atrophy with small vessel disease. No midline shift. No other osseous lesions other than what might be associated with the LEFT middle cranial fossa abnormality.  IMPRESSION: Five definite intracranial metastatic deposits as described. The dominant lesion is in the RIGHT occipital lobe. LEFT middle cranial fossa pachymeningeal enhancement as described.   Electronically Signed   By: Rolla Flatten M.D.   On: 07-09-14 15:10     Impression and Plan:   68 year old woman with the following issues:  1. Right lung mass measuring 2.6 x 2.3 centimeters in the right upper lobe. She has chest adenopathy as well as enlarged adrenal gland that has been biopsied which showed benign  findings. This most likely represents lung neoplasm that a biopsy is paramount in making that diagnosis. The easiest way to obtain tissue diagnosis at this time is likely to a bronchoscopy. I will refer her to pulmonary medicine for evaluation of possible bronchoscopy. Once we have a tissue diagnosis, I will discuss with her the systemic therapy that is associated with  lung malignancy.  2. Brain metastasis: MRI from 07/09/2014 was reviewed and showed multiple lesions with a dominant lesion in the right occipital lobe. She is planning to have radiation therapy in the near future and she is currently on dexamethasone and Keppra. She does have slight right upper arm weakness but has been improving.  3. Followup: Will be after her biopsy in the completion of her radiation therapy to discuss the role of systemic therapy.   [facility], MD 09-01-154:17 PM

## 2014-06-25 NOTE — Progress Notes (Signed)
Checked in new pt with no financial concerns. °

## 2014-06-26 ENCOUNTER — Inpatient Hospital Stay: Payer: BC Managed Care – PPO | Admitting: Oncology

## 2014-06-26 ENCOUNTER — Other Ambulatory Visit: Payer: BC Managed Care – PPO

## 2014-06-26 ENCOUNTER — Ambulatory Visit: Admission: RE | Admit: 2014-06-26 | Payer: Medicare HMO | Source: Ambulatory Visit

## 2014-06-26 ENCOUNTER — Ambulatory Visit: Admission: RE | Admit: 2014-06-26 | Payer: Medicare HMO | Source: Ambulatory Visit | Admitting: Radiation Oncology

## 2014-06-26 ENCOUNTER — Ambulatory Visit: Payer: BC Managed Care – PPO

## 2014-06-27 ENCOUNTER — Ambulatory Visit: Payer: Medicare HMO

## 2014-06-27 ENCOUNTER — Ambulatory Visit
Admission: RE | Admit: 2014-06-27 | Discharge: 2014-06-27 | Disposition: A | Discharge status: Home / Self Care | Payer: Medicare HMO | Source: Ambulatory Visit | Attending: Radiation Oncology | Admitting: Radiation Oncology

## 2014-06-27 ENCOUNTER — Telehealth: Payer: Self-pay | Admitting: *Deleted

## 2014-06-27 ENCOUNTER — Ambulatory Visit: Payer: Medicare HMO | Admitting: Radiation Oncology

## 2014-06-27 VITALS — BP 195/66 | HR 61 | Temp 97.7°F

## 2014-06-27 DIAGNOSIS — C7931 Secondary malignant neoplasm of brain: Secondary | ICD-10-CM

## 2014-06-27 DIAGNOSIS — R918 Other nonspecific abnormal finding of lung field: Secondary | ICD-10-CM

## 2014-06-27 MED ORDER — SODIUM CHLORIDE 0.9 % IJ SOLN
10.0000 mL | Freq: Once | INTRAMUSCULAR | Status: AC
  Administered 2014-06-27: 10 mL via INTRAVENOUS

## 2014-06-27 NOTE — Telephone Encounter (Signed)
Called patient asking if she was on her way for her 215 pm appt time, "Yes, we are on our way,we just left Dr.Stern's office", called [place] informed her of late appt 2:23 PM

## 2014-06-27 NOTE — Progress Notes (Signed)
Kristina Lawson here for IV start prior to her simulation encounter.   Note bruising on her arms. See Doc. Flow Sheet.  established on second IV attempt.

## 2014-06-27 NOTE — Progress Notes (Signed)
Radiation Oncology         [phone] ________________________________  Name: Grete Bosko MRN: [id number]  Date: 06/24/2014  DOB: 1945-12-17  WN:[id number],UYQIHK TOM, MD  Wyatt Portela, MD     REFERRING PHYSICIAN: Wyatt Portela, MD   DIAGNOSIS: The encounter diagnosis was Brain metastasis.   HISTORY OF PRESENT ILLNESS::Mykeria Sandeen is a 68 y.o. female who is seen for an initial consultation visit. The patient was initially admitted to do a witnessed generalized seizure. On questioning, the patient also had been experiencing a history of left facial swelling and some right shoulder pain. She denied any vision changes or confusion. No prior seizures in the past.  The patient underwent a CT scan of the head on 06/17/2014. This showed an area of hypodensity in the right parieto-occipital region indicative of vasogenic edema. The patient was placed on Keppra as well as Decadron. She denies any further seizures.   The patient proceeded to undergo an MRI scan of the brain. This revealed 4 intracranial metastases with the largest area corresponding to a 14 mm mass with surrounding edema.  This also demonstrated a large tumor involving the left sphenoid. This extended to the lateral aspect and floor of the middle cranial fossa and into the soft tissues of the masticator space.  On questioning the patient has complained of some mild difficulty chewing as well as swelling in this region. She really denies any left facial pain. She also denies any worsening shortness of breath or significant weight loss.  Further workup has included a CT scan of the chest abdomen and pelvis. The findings overall were consistent with metastatic lung carcinoma. A lobular mass was present in the right lung measuring at least 2.6 cm. Significant subcarinal, right hilar, and right paratracheal lymphadenopathy was seen. The left adrenal gland measured 3.4 cm and was also felt to likely represent metastatic  carcinoma.  The patient has undergone a CT-guided biopsy of the left adrenal gland. Final pathology revealed no evidence of malignancy.   PREVIOUS RADIATION THERAPY: No   PAST MEDICAL HISTORY:  has a past medical history of Hypertension; Smoker; Hyperlipidemia; Brain cancer (06/17/14); Lung cancer (06/18/14 CT); and Seizures (06/17/14).     PAST SURGICAL HISTORY: Past Surgical History  Procedure Laterality Date  . Ct biopsy  06/19/14    Left adrenal mass     FAMILY HISTORY: family history includes Heart disease in her mother.   SOCIAL HISTORY:  reports that she has been smoking.  She does not have any smokeless tobacco history on file. She reports that she drinks alcohol. She reports that she does not use illicit drugs.   ALLERGIES: Review of patient's allergies indicates no known allergies.   MEDICATIONS:  Current Outpatient Prescriptions  Medication Sig Dispense Refill  . bisoprolol (ZEBETA) 5 MG tablet Take 1 tablet (5 mg total) by mouth daily.  30 tablet  0  . Cholecalciferol (VITAMIN D) 2000 UNITS tablet Take 2,000 Units by mouth daily.      Marland Kitchen dexamethasone (DECADRON) 4 MG tablet Take 1 tablet (4 mg total) by mouth 2 (two) times daily with a meal.  60 tablet  0  . levETIRAcetam (KEPPRA) 500 MG tablet Take 1 tablet (500 mg total) by mouth 2 (two) times daily.  60 tablet  0  . Omega-3 Fatty Acids (FISH OIL) 1200 MG CAPS Take 1,200 mg by mouth daily.        No current facility-administered medications for this encounter.     REVIEW  OF SYSTEMS:  A 15 point review of systems is documented in the electronic medical record. This was obtained by the nursing staff. However, I reviewed this with the patient to discuss relevant findings and make appropriate changes.  Pertinent items are noted in HPI.    PHYSICAL EXAM:  height is 5\' 3"  (1.6 m) and weight is 169 lb 14.4 oz (77.066 kg). Her oral temperature is 97.4 F (36.3 C). Her blood pressure is 120/59 and her pulse is 74. Her  respiration is 20 and oxygen saturation is 99%.   ECOG = 1  0 - Asymptomatic (Fully active, able to carry on all predisease activities without restriction)  1 - Symptomatic but completely ambulatory (Restricted in physically strenuous activity but ambulatory and able to carry out work of a light or sedentary nature. For example, light housework, office work)  2 - Symptomatic, <50% in bed during the day (Ambulatory and capable of all self care but unable to carry out any work activities. Up and about more than 50% of waking hours)  3 - Symptomatic, >50% in bed, but not bedbound (Capable of only limited self-care, confined to bed or chair 50% or more of waking hours)  4 - Bedbound (Completely disabled. Cannot carry on any self-care. Totally confined to bed or chair)  5 - Death   Eustace Pen MM, Creech RH, Tormey DC, et al. [phone]). "Toxicity and response criteria of the [facility]". [place] Oncol. 5 (6): 649-55  General: Well-developed, in no acute distress HEENT: Normocephalic, atraumatic; oral cavity clear Neck: Supple without any lymphadenopathy Cardiovascular: Regular rate and rhythm Respiratory: Clear to auscultation bilaterally GI: Soft, nontender, normal bowel sounds Extremities: No edema present Neuro: No focal deficits     LABORATORY DATA:  Lab Results  Component Value Date   WBC 8.3 06/18/2014   HGB 13.4 06/18/2014   HCT 40.2 06/18/2014   MCV 90.3 06/18/2014   PLT 310 06/18/2014   Lab Results  Component Value Date   NA 136* 06/19/2014   K 4.0 06/19/2014   CL 98 06/19/2014   CO2 23 06/19/2014   Lab Results  Component Value Date   ALT 13 06/18/2014   AST 18 06/18/2014   ALKPHOS 72 06/18/2014   BILITOT 0.6 06/18/2014      RADIOGRAPHY: Dg Chest 2 View  06/17/2014   CLINICAL DATA:  Seizure.  EXAM: CHEST  2 VIEW  COMPARISON:  None  FINDINGS: The lungs are well-aerated. Focal right basilar airspace opacity is compatible with pneumonia, with additional  right perihilar opacity seen. Mild vascular congestion is noted. No pleural effusion or pneumothorax is identified.  The heart is mildly enlarged. No acute osseous abnormalities are seen.  IMPRESSION: 1. Focal right basilar and right perihilar airspace opacity is compatible with pneumonia. Would treat for pneumonia, then perform follow-up chest radiograph to ensure resolution of airspace opacity. If the patient has no symptoms for pneumonia, would correlate to exclude aspiration. Mass cannot be excluded, but is considered less likely given the appearance of the opacity. 2. Mild vascular congestion and mild cardiomegaly noted.   Electronically Signed   By: Garald Balding M.D.   On: 06/17/2014 22:13   Ct Head Wo Contrast   (if New Onset Seizure And/or Head Trauma)  06/17/2014   CLINICAL DATA:  Seizures  EXAM: CT HEAD WITHOUT CONTRAST  TECHNIQUE: Contiguous axial images were obtained from the base of the skull through the vertex without intravenous contrast.  COMPARISON:  None.  FINDINGS: Abnormal white matter hypodensity in the right occipital lobe and right posterior parietal lobe is asymmetric. Although the possibility of some cortical involvement is raised, this primarily appears to involve the white matter and is accordingly suspicious for vasogenic edema. Right upper parietal gyri appear asymmetric on image 25 of series 2.  The brainstem, cerebellum, basal ganglia, ventricular system, and basilar cisterns appear otherwise unremarkable. There is atherosclerotic calcification of the cavernous carotid arteries bilaterally.  IMPRESSION: 1. Asymmetric abnormal white matter hypodensity in the right occipital lobe and right parietal lobe posteriorly. This primarily resembles vasogenic edema and raises the possibility of underlying tumor, encephalitis, demyelinating process, or less likely an unusual ischemic process. Posterior reversible encephalopathy is considered less likely given the unilaterality. MRI brain with  and without contrast is recommended. Also consider obtaining a chest radiograph.  These results were called by telephone at the time of interpretation on 06/17/2014 at 8:12 pm to Dr. Lajean Saver, who verbally acknowledged these results.   Electronically Signed   By: Sherryl Barters M.D.   On: 06/17/2014 20:13   Ct Chest W Contrast  06/18/2014   CLINICAL DATA:  Known intracranial metastatic disease  EXAM: CT CHEST, ABDOMEN, AND PELVIS WITH CONTRAST  TECHNIQUE: Multidetector CT imaging of the chest, abdomen and pelvis was performed following the standard protocol during bolus administration of intravenous contrast.  CONTRAST:  142mL OMNIPAQUE IOHEXOL 300 MG/ML  SOLN  COMPARISON:  Chest x-ray from the previous day  FINDINGS: CT CHEST FINDINGS  The left lung is well aerated. A 9 mm nodule is noted within the left lateral costophrenic angle. The right lung is well aerated but demonstrates evidence of a the lobular mass lesion which measures at least 2.6 x 2.3 cm within the right upper lobe posteriorly. Some associated atelectatic changes are noted. Additionally there is significant sub carinal and right hilar and right paratracheal lymphadenopathy. The right paratracheal adenopathy measures 2.8 x 2.3 cm. The subcarinal and right hilar adenopathy is confluent and measures at least 7.0 by 5.6 cm. It encases the superior vena cava and right pulmonary artery and causes some stenosis but no obstruction is seen. No thrombus is noted. Coronary calcifications are seen.  CT ABDOMEN AND PELVIS FINDINGS  The liver, gallbladder, spleen and right adrenal gland are within normal limits. The pancreas is unremarkable. The left adrenal gland demonstrates a 3.4 x 3.2 cm mass lesion consistent with metastatic disease. Kidneys are well visualized and demonstrate evidence of portion to deformity with an apparent central fibrous band. No renal calculi or obstructive changes are noted. Renal vascular calcifications are seen. Diffuse aortic  calcifications are noted without aneurysmal dilatation.  The appendix is within normal limits. Mild diverticular change is noted without evidence of diverticulitis. The bladder is well distended. The uterus show some scattered calcifications likely related uterine fibroid disease. No definitive bony metastatic disease is noted.  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 Vascular encasement without occlusion or thrombosis is noted.  These results will be called to the ordering clinician or representative by the Radiologist Assistant, and communication documented in the PACS or zVision Dashboard.   Electronically Signed   By: Inez Catalina M.D.   On: 06/18/2014 13:54   Mr Jeri Cos QA Contrast  06/25/2014   CLINICAL DATA:  SRS targeting. History of seizure. Suspected  lung cancer.  EXAM: MRI HEAD WITHOUT AND WITH CONTRAST  TECHNIQUE: Multiplanar, multiecho pulse sequences of the brain and surrounding structures were obtained without and with intravenous contrast.  CONTRAST:  MultiHance 15 mL.  COMPARISON:  CT head 06/17/2014. MR head 06/17/2014 performed at [facility].  FINDINGS: Multiple intracranial metastatic deposits are redemonstrated. No additional lesions are seen in comparison with the prior 3T MR from [facility]. Lesions are listed from inferior to superior, with referenced axial series 10 postcontrast, as follows:  - 7 x 5 mm, image 29, LEFT inferior cerebellum.  - 5 x 7 mm, image 38, RIGHT inferior cerebellum.  - 13 x 12 by 7 mm, image 59, LEFT anterior and inferior temporal lobe. Dural thickening along the lateral margin of the middle cranial fossa is associated with this lesion, which could be intra-axial and extra-axial. The dural enhancement on the LEFT extends toward the convexity over a craniocaudal extent  of roughly 6 cm.  - 4 mm, image 84, LEFT external capsule.  - 13 x 14 x 13 mm, image 85, RIGHT occipital lobe. This last lesion is the dominant abnormality and is associated with moderate vasogenic edema.  Redemonstrated atrophy with small vessel disease. No midline shift. No other osseous lesions other than what might be associated with the LEFT middle cranial fossa abnormality.  IMPRESSION: Five definite intracranial metastatic deposits as described. The dominant lesion is in the RIGHT occipital lobe. LEFT middle cranial fossa pachymeningeal enhancement as described.   Electronically Signed   By: Rolla Flatten M.D.   On: 06/25/2014 15:10   Mr Jeri Cos JX Contrast  06/17/2014   CLINICAL DATA:  Seizure.  Abnormal head CT.  EXAM: MRI HEAD WITHOUT AND WITH CONTRAST  TECHNIQUE: Multiplanar, multiecho pulse sequences of the brain and surrounding structures were obtained without and with intravenous contrast.  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  There is a 14 mm avidly enhancing lesion in the right parieto-occipital region without central necrosis. There is pronounced regional vasogenic edema. There is a second lesion within the cerebellum on the right measuring 4 mm in diameter. There is a third lesion within the inferior cerebellum on the left measuring 5 mm in diameter. There is a 4 mm lesion in the right frontal region. There is a questionable fifth lesion deep to the insula on the left measuring about 4 mm.  No evidence of hydrocephalus.  No extra-axial fluid collection.  There is an extensive lesion centered within the sphenoid bone on the left with enhancing mass extending along the lateral margin and floor of the middle cranial fossa and into the masticator space. This is presumed to represent an additional site of metastatic disease. Theoretically, this could  be a primary lesion from which the brain metastases arose.  IMPRESSION: 4 and possibly 5 brain masses consistent with metastatic disease. The largest lesion was visible at CT. This represents a 14 mm mass in the right parietal occipital brain with surrounding edema. There are 2 lesions an the cerebellum and a few other scattered within the cerebral hemispheres.  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 This could represent metastatic disease or could be a primary lesion of some sort.  Electronically Signed   By: Nelson Chimes M.D.   On: 06/17/2014 21:47   Ct Abdomen Pelvis W Contrast  06/18/2014   CLINICAL DATA:  Known intracranial metastatic disease  EXAM: CT CHEST, ABDOMEN, AND PELVIS WITH CONTRAST  TECHNIQUE: Multidetector CT imaging of the chest, abdomen and pelvis was performed following the standard protocol during bolus administration of intravenous contrast.  CONTRAST:  124mL OMNIPAQUE IOHEXOL 300 MG/ML  SOLN  COMPARISON:  Chest x-ray from the previous day  FINDINGS: CT CHEST FINDINGS  The left lung is well aerated. A 9 mm nodule is noted within the left lateral costophrenic angle. The right lung is well aerated but demonstrates evidence of a the lobular mass lesion which measures at least 2.6 x 2.3 cm within the right upper lobe posteriorly. Some associated atelectatic changes are noted. Additionally there is significant sub carinal and right hilar and right paratracheal lymphadenopathy. The right paratracheal adenopathy measures 2.8 x 2.3 cm. The subcarinal and right hilar adenopathy is confluent and measures at least 7.0 by 5.6 cm. It encases the superior vena cava and right pulmonary artery and causes some stenosis but no obstruction is seen. No thrombus is noted. Coronary calcifications are seen.  CT ABDOMEN AND PELVIS FINDINGS  The liver, gallbladder, spleen  and right adrenal gland are within normal limits. The pancreas is unremarkable. The left adrenal gland demonstrates a 3.4 x 3.2 cm mass lesion consistent with metastatic disease. Kidneys are well visualized and demonstrate evidence of portion to deformity with an apparent central fibrous band. No renal calculi or obstructive changes are noted. Renal vascular calcifications are seen. Diffuse aortic calcifications are noted without aneurysmal dilatation.  The appendix is within normal limits. Mild diverticular change is noted without evidence of diverticulitis. The bladder is well distended. The uterus show some scattered calcifications likely related uterine fibroid disease. No definitive bony metastatic disease is noted.  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 Vascular encasement without occlusion or thrombosis is noted.  These results will be called to the ordering clinician or representative by the Radiologist Assistant, and communication documented in the PACS or zVision Dashboard.   Electronically Signed   By: Inez Catalina M.D.   On: 06/18/2014 13:54   Ct Biopsy  06/19/2014   CLINICAL DATA:  Right upper lobe lung mass, mediastinal and hilar adenopathy, left adrenal mass and multiple brain metastases. After reviewing previous imaging, the left adrenal mass has been determined to be the safest source for tissue diagnosis.  EXAM: CT GUIDED CORE BIOPSY OF LEFT ADRENAL GLAND  ANESTHESIA/SEDATION: 2.0  Mg IV Versed; 100 mcg IV Fentanyl  Total Moderate Sedation Time: 30 minutes.  PROCEDURE: The procedure risks, benefits, and alternatives were explained to the patient. Questions regarding the procedure were encouraged and answered. The patient understands and consents to the procedure.  The left flank region was prepped  with Betadine in a sterile fashion, and a sterile drape was applied covering the operative field. A sterile gown and sterile gloves were used for the procedure. Local anesthesia was provided with 1% Lidocaine.  The patient was placed in a decubitus position with the left side down. Unenhanced CT was performed through the upper abdomen. Under CT fluoroscopic guidance, a 17 gauge needle was advanced to the level of a left adrenal mass. A total of 2  separate 18 gauge core biopsy samples were obtained. Gel-Foam pledgets were advanced through the 17 gauge needle. The needle was removed and additional CT imaging performed.  COMPLICATIONS: None  FINDINGS: The left adrenal mass was localized and measures approximately 3.3 cm in diameter. The mass is low in density by CT and may be necrotic. Solid tissue was obtained with core biopsy. Post biopsy imaging shows no significant hemorrhage or evidence of pneumothorax following the procedure.  IMPRESSION: CT-guided core biopsy performed of the left adrenal mass.   Electronically Signed   By: Aletta Edouard M.D.   On: 06/19/2014 16:59   Dg Humerus Right  06/18/2014   CLINICAL DATA:  Right arm pain without history of trauma  EXAM: RIGHT HUMERUS - 2+ VIEW  COMPARISON:  None.  FINDINGS: The humerus is adequately mineralized. No acute fracture or dislocation is demonstrated. Portions of the upper aspect of the shaft are obscured 2 of the images. The observed portions of the glenohumeral joint and of the elbow exhibit no acute abnormalities. The soft tissues of the arm are unremarkable.  IMPRESSION: There is no acute bony abnormality of the right humerus.   Electronically Signed   By: David  Martinique   On: 06/18/2014 18:20       IMPRESSION: The patient has clinical findings suggestive of metastatic lung cancer with brain metastasis. A suspicious left adrenal gland prompted a CT-guided biopsy as the best site for obtaining tissue. However this did not show evidence of malignancy.  Significant thoracic disease appears present and therefore I discussed with the patient that a second biopsy is recommended with her findings still indicative of metastatic lung cancer.  The patient's case was discussed in multidisciplinary brain conference this morning. She is felt to be a good potential candidate for radiosurgery. I discussed this possible planned with the patient area we discussed the rationale of such a treatment as well as the possible side effects and risks. I also discussed that we would need to obtain tissue prior to proceeding with this treatment. If this showed small cell lung cancer for instance, then the patient would most likely benefit from whole brain radiation treatment.  The patient also appears to be a good candidate for palliative radiation treatment to the metastasis involving the sphenoid bone on the left. She will also require simulation and treatment planning for a likely 2-3 week course of palliative radiation treatment to this site as well. We discussed this in detail in terms of the possible benefit of such treatment as well as the possible side effects and risks which could include such side effects as xerostomia, skin irritation, and irritation of the oral cavity/oropharynx.   PLAN: Clinical diagnosis of stage IV lung cancer  The patient will proceed with treatment planning and she also will be scheduled for further evaluation regarding a second biopsy. I will discuss this with interventional radiology I believe most likely the patient will have to be seen by pulmonary medicine to obtain tissue for a bronchoscopy.      ________________________________   Jodelle Gross, MD, PhD   **Disclaimer: This note was dictated with voice recognition software. Similar sounding words can inadvertently be transcribed and this note may contain transcription errors which may not have been corrected upon publication of note.**

## 2014-06-27 NOTE — Progress Notes (Signed)
D/c IV catheter RAC, placed 2 2x2 gause dressing over site and taped, gave instructions for patient to leave on for a few hours if removing bleeding occurs place dressing back on and apply pressure 5 minutes, leave on overnight then, if no bleeding put bandaid over RAC, call for any unusual symptoms, sob, fever, patient gave verbal understanding 4:22 PM

## 2014-06-28 ENCOUNTER — Telehealth: Payer: Self-pay | Admitting: *Deleted

## 2014-06-28 ENCOUNTER — Encounter: Payer: Self-pay | Admitting: Internal Medicine

## 2014-06-28 ENCOUNTER — Ambulatory Visit (INDEPENDENT_AMBULATORY_CARE_PROVIDER_SITE_OTHER): Payer: Medicare HMO | Admitting: Internal Medicine

## 2014-06-28 VITALS — BP 156/72 | HR 63 | Temp 98.2°F | Ht 63.0 in | Wt 168.0 lb

## 2014-06-28 DIAGNOSIS — R918 Other nonspecific abnormal finding of lung field: Secondary | ICD-10-CM

## 2014-06-28 DIAGNOSIS — R222 Localized swelling, mass and lump, trunk: Secondary | ICD-10-CM

## 2014-06-28 MED ORDER — LORAZEPAM 0.5 MG PO TABS: 0.5000 mg | ORAL_TABLET | Freq: Once | ORAL | Status: DC | PRN

## 2014-06-28 NOTE — Patient Instructions (Signed)
Please see patient coordinator before you leave today  to schedule for PET scan asap and I will call you with results

## 2014-06-28 NOTE — Progress Notes (Addendum)
Subjective:    Patient ID: Kristina Lawson, female    DOB: [birth year]  MRN: [id number]  HPI  53 yobf smoker with new onset sz admit [facility]   Admit date: 06/17/2014  Discharge date: 06/19/2014   Recommendations for Outpatient Follow-up:  1. Dr.Shadad 8/19 at Cancer center 2. Radiation Oncology 8/20 at 10:30am 3. Instructed not to drive or operate machinery till cleared by PCP Discharge Diagnoses:  Generalized seizure   Brain metastasis  HTN (hypertension)  Seizures  Lung mass  Discharge Condition: stable  Diet recommendation: heart healthy  Filed Weights    06/17/14 2350   Weight:  78.7 kg (173 lb 8 oz)   History of present illness:  Kristina Lawson is a 68 y.o. female with history of hypertension and hyperlipidemia was brought to the ER after patient had an episode of seizures. In the ER patient initially had CT head followed by MRI brain which showed - "4 and possibly 5 brain masses consistent with metastatic disease. The largest lesion was visible at CT. This represents a 14 mm mass in the right parietal occipital brain with surrounding edema. There are 2 lesions an the cerebellum and a few other scattered within the cerebral hemispheres. 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 This could represent metastatic disease or could be a primary lesion of some sort.". Neurologist on-call Dr. Nicole Kindred was consulted patient was placed on Decadron and Keppra and admitted for further workup.  Hospital Course:  1. GTC seizures  -due to brain mets noted on MRI  -Seen by Neurology, started on Keppra and decadron    -FU with Rad Onc appt made for 8/20, seen by Oncology Dr.SHadad, completed adrenal biopsy > neg  2. Suspected Metastatic lung CA  -lung mass noted on CT  -s/p Adrenal biopsy today per IR  -Oncology FU on 8/19  3. HTN  -stable, continue  bisoprolol, HCTZ stopped due to hyponatremia  4. Tobacco abuse  -counseled    06/28/2014 1st [facility] office visit/ Marieli Rudy/ consultation re lung mass  Chief Complaint  Patient presents with  . Pulmonary Consult    Referred by Dr. Alen Blew for lung mass. Pt denies SOB, cough and CP/tightness.   no h/o hemoptysis or pleuritic cp.  No obvious day to day or daytime variabilty or assoc chronic cough or  chest tightness, subjective wheeze overt sinus or hb symptoms. No unusual exp hx or h/o childhood pna/ asthma or knowledge of premature birth.  Sleeping ok without nocturnal  or early am exacerbation  of respiratory  c/o's or need for noct saba. Also denies any obvious fluctuation of symptoms with weather or environmental changes or other aggravating or alleviating factors except as outlined above   Current Medications, Allergies, Complete Past Medical History, Past Surgical History, Family History, and Social History were reviewed in [employer] record.  ROS  The following are not active complaints unless bolded sore throat, dysphagia, dental problems, itching, sneezing,  nasal congestion or excess/ purulent secretions, ear ache,   fever, chills, sweats, unintended wt loss, pleuritic or exertional cp, hemoptysis,  orthopnea pnd or leg swelling, presyncope, palpitations, heartburn, abdominal pain, anorexia, nausea, vomiting, diarrhea  or change in bowel or urinary habits, change in stools or urine, dysuria,hematuria,  rash, arthralgias, visual complaints, headache, numbness weakness RUE or ataxia or problems with walking or coordination,  change in mood/affect or memory.         Review of Systems  Constitutional: Negative for fever and unexpected weight change.  HENT: Negative for congestion, dental problem, ear pain, nosebleeds, postnasal drip, rhinorrhea, sinus pressure, sneezing, sore throat and trouble swallowing.   Eyes: Negative for redness and itching.    Respiratory: Negative for cough, chest tightness, shortness of breath and wheezing.   Cardiovascular: Negative for palpitations and leg swelling.  Gastrointestinal: Negative for nausea and vomiting.  Genitourinary: Negative for dysuria.  Musculoskeletal: Positive for joint swelling.  Skin: Negative for rash.  Neurological: Negative for headaches.  Hematological: Does not bruise/bleed easily.  Psychiatric/Behavioral: Negative for dysphoric mood. The patient is not nervous/anxious.        Objective:   Physical Exam   amb bf nad  Implant top/ partial   Wt Readings from Last 3 Encounters:  06/28/14 168 lb (76.204 kg)  06/25/14 169 lb 8 oz (76.885 kg)  06/17/14 173 lb 8 oz (78.7 kg)      HEENT: nl dentition, turbinates, and orophanx. Nl external ear canals without cough reflex   NECK :  without JVD/Nodes/TM/ nl carotid upstrokes bilaterally   LUNGS: no acc muscle use, clear to A and P bilaterally without cough on insp or exp maneuvers   CV:  RRR  no s3 or murmur or increase in P2, no edema   ABD:  soft and nontender with nl excursion in the supine position. No bruits or organomegaly, bowel sounds nl  MS:  warm without deformities, calf tenderness, cyanosis or clubbing  SKIN: warm and dry without lesions    NEURO:  alert, approp, has trouble abducting R Shoulder     06/18/14 CT chtest The left lung is well aerated. A 9 mm nodule is noted within the  left lateral costophrenic angle. The right lung is well aerated but  demonstrates evidence of a the lobular mass lesion which measures at  least 2.6 x 2.3 cm within the right upper lobe posteriorly. Some  associated atelectatic changes are noted. Additionally there is  significant sub carinal and right hilar and right paratracheal  lymphadenopathy. The right paratracheal adenopathy measures 2.8 x  2.3 cm. The subcarinal and right hilar adenopathy is confluent and  measures at least 7.0 by 5.6 cm. It encases the superior  vena cava  and right pulmonary artery and causes some stenosis but no  obstruction is seen. No thrombus is noted. Coronary calcifications  are seen.       Assessment & Plan:

## 2014-06-28 NOTE — Telephone Encounter (Signed)
Called [facility] home [phone] spoke with Artelia, she was informed her Rx Ativan has been called in to [place] pick up later today at her convenience,patient thanked Dr.moody and this RN 9:34 AM

## 2014-06-28 NOTE — Telephone Encounter (Signed)
Called in Rx for Ativan(lorazapam) tablet( 0.5mg  total) take 1 tab by mouth once as needed for anxiety,take 30 minutes prior to treatment for anxiety, no refills total 5 tablets ,Dr.John Moody,MD, spoke with Lenis Noon, pharmacist at [facility] on [address] , [place], at [phone], ,will call patient to let know Rx has been called in 9:32 AM

## 2014-06-29 NOTE — Assessment & Plan Note (Signed)
Clearly this is stage IV lung ca with breain mets and the challenge is finding easiest tissue dx having failed to make dx by adrenal bx and pt skeptical now that she has ca at all.   Discussed in detail all the  indications, usual  risks and alternatives  relative to the benefits with patient and son who agree  to proceed with PET first and then fob with the only issue if fob whether to wait to do ebus under general anesthesia (a bit risky with head mets) or attempt "blind" bx's by fob which should have reasonable yield at level of anterior carina.

## 2014-07-03 ENCOUNTER — Encounter (HOSPITAL_COMMUNITY): Payer: Self-pay

## 2014-07-03 ENCOUNTER — Ambulatory Visit (HOSPITAL_COMMUNITY)
Admission: RE | Admit: 2014-07-03 | Discharge: 2014-07-03 | Disposition: A | Discharge status: Home / Self Care | Payer: Medicare HMO | Source: Ambulatory Visit | Attending: Internal Medicine | Admitting: Internal Medicine

## 2014-07-03 DIAGNOSIS — R918 Other nonspecific abnormal finding of lung field: Secondary | ICD-10-CM

## 2014-07-03 DIAGNOSIS — R222 Localized swelling, mass and lump, trunk: Secondary | ICD-10-CM | POA: Insufficient documentation

## 2014-07-03 DIAGNOSIS — R948 Abnormal results of function studies of other organs and systems: Secondary | ICD-10-CM | POA: Diagnosis not present

## 2014-07-03 LAB — GLUCOSE, CAPILLARY: Glucose-Capillary: 103 mg/dL — ABNORMAL HIGH (ref 70–99)

## 2014-07-03 MED ORDER — FLUDEOXYGLUCOSE F - 18 (FDG) INJECTION: 8.9000 | Freq: Once | INTRAVENOUS | Status: AC | PRN

## 2014-07-04 ENCOUNTER — Encounter (HOSPITAL_COMMUNITY): Payer: Self-pay | Admitting: Pharmacy Technician

## 2014-07-04 ENCOUNTER — Other Ambulatory Visit: Payer: Self-pay | Admitting: Internal Medicine

## 2014-07-04 ENCOUNTER — Telehealth: Payer: Self-pay | Admitting: Internal Medicine

## 2014-07-04 DIAGNOSIS — M84421A Pathological fracture, right humerus, initial encounter for fracture: Secondary | ICD-10-CM

## 2014-07-04 DIAGNOSIS — R918 Other nonspecific abnormal finding of lung field: Secondary | ICD-10-CM

## 2014-07-04 DIAGNOSIS — M84429A Pathological fracture, unspecified humerus, initial encounter for fracture: Secondary | ICD-10-CM | POA: Insufficient documentation

## 2014-07-04 NOTE — Telephone Encounter (Signed)
Spoke with [place] and she stated that libby is working on this now.  Will forward to libby.

## 2014-07-04 NOTE — Telephone Encounter (Signed)
Order sent to [facility] 

## 2014-07-05 ENCOUNTER — Encounter: Payer: Self-pay | Admitting: Internal Medicine

## 2014-07-05 ENCOUNTER — Ambulatory Visit (HOSPITAL_COMMUNITY)
Admission: RE | Admit: 2014-07-05 | Discharge: 2014-07-05 | Disposition: A | Discharge status: Home / Self Care | Payer: Medicare HMO | Source: Ambulatory Visit | Attending: Internal Medicine | Admitting: Internal Medicine

## 2014-07-05 ENCOUNTER — Other Ambulatory Visit: Payer: Self-pay | Admitting: Radiology

## 2014-07-05 DIAGNOSIS — R222 Localized swelling, mass and lump, trunk: Secondary | ICD-10-CM | POA: Diagnosis present

## 2014-07-05 DIAGNOSIS — R918 Other nonspecific abnormal finding of lung field: Secondary | ICD-10-CM

## 2014-07-05 LAB — CBC
HEMATOCRIT: 42.9 % (ref 36.0–46.0)
HEMOGLOBIN: 14.4 g/dL (ref 12.0–15.0)
MCH: 31 pg (ref 26.0–34.0)
MCHC: 33.6 g/dL (ref 30.0–36.0)
MCV: 92.3 fL (ref 78.0–100.0)
Platelets: 299 10*3/uL (ref 150–400)
RBC: 4.65 MIL/uL (ref 3.87–5.11)
RDW: 13.9 % (ref 11.5–15.5)
WBC: 14.4 10*3/uL — AB (ref 4.0–10.5)

## 2014-07-05 LAB — PROTIME-INR
INR: 1.04 (ref 0.00–1.49)
Prothrombin Time: 13.6 seconds (ref 11.6–15.2)

## 2014-07-05 LAB — APTT: aPTT: 28 seconds (ref 24–37)

## 2014-07-05 MED ORDER — MIDAZOLAM HCL 2 MG/2ML IJ SOLN
INTRAMUSCULAR | Status: AC | PRN
  Administered 2014-07-05 (×3): 1 mg via INTRAVENOUS

## 2014-07-05 MED ORDER — FENTANYL CITRATE 0.05 MG/ML IJ SOLN
INTRAMUSCULAR | Status: AC
  Filled 2014-07-05: qty 4

## 2014-07-05 MED ORDER — SODIUM CHLORIDE 0.9 % IV SOLN
Freq: Once | INTRAVENOUS | Status: AC
  Administered 2014-07-05: 10:00:00 via INTRAVENOUS

## 2014-07-05 MED ORDER — MIDAZOLAM HCL 2 MG/2ML IJ SOLN
INTRAMUSCULAR | Status: AC
  Filled 2014-07-05: qty 4

## 2014-07-05 MED ORDER — FENTANYL CITRATE 0.05 MG/ML IJ SOLN
INTRAMUSCULAR | Status: AC | PRN
  Administered 2014-07-05 (×2): 50 ug via INTRAVENOUS

## 2014-07-05 MED ORDER — LIDOCAINE-EPINEPHRINE 1 %-1:100000 IJ SOLN
INTRAMUSCULAR | Status: AC
  Filled 2014-07-05: qty 1

## 2014-07-05 NOTE — Discharge Instructions (Signed)
Biopsy Care After These instructions give you information on caring for yourself after your procedure. Your doctor may also give you more specific instructions. Call your doctor if you have any problems or questions after your procedure. HOME CARE   Return to your normal diet and activities as told by your doctor.  Change your bandages (dressings) as told by your doctor. If skin glue (adhesive) was used, it will peel off in 7 days.  Only take medicines as told by your doctor.  Ask your doctor when you can bathe and get your wound wet. GET HELP RIGHT AWAY IF:  You see more than a small spot of blood coming from the wound.  You have redness, puffiness (swelling), or pain.  You see yellowish-white fluid (pus) coming from the wound.  You have a fever.  You notice a bad smell coming from the wound or bandage.  You have a rash, trouble breathing, or any allergy problems. MAKE SURE YOU:   Understand these instructions.  Will watch your condition.  Will get help right away if you are not doing well or get worse. Document Released: 06/29/2011 Document Revised: 01/17/2012 Document Reviewed: 06/29/2011 ExitCare Patient Information 2015 [employer]. This information is not intended to replace advice given to you by your health care provider. Make sure you discuss any questions you have with your health care provider.  

## 2014-07-05 NOTE — Procedures (Signed)
Technically successful US guided biopsy of right supraclavicular LN.  No immediate complications.

## 2014-07-05 NOTE — H&P (Signed)
Kristina Lawson is an 68 y.o. female.   Chief Complaint: pt has suspected stage IV lung cancer 07/03/2014 PET + in many areas RUL mass; L parotid; R SCLN; R Hilar mass; mediastinum; infratemporal fossa lesion; pancreas; ovaries and osseous metastasis Adrenal mass biopsy performed 06/19/14 with Dr Kathlene Cote read as adenoma Pt scheduled now for Rt Supraclavicular lymph node biopsy  HPI: HTN; smoker; HLD; brain mets; lung lesion; seizure  Past Medical History  Diagnosis Date  . Hypertension   . Smoker   . Hyperlipidemia   . Brain cancer 06/17/14    multiple brain metastses  . 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 Seizures 06/17/14    witness at home brought to the ED    Past Surgical History  Procedure Laterality Date  . Ct biopsy  06/19/14    Left adrenal mass    Family History  Problem Relation Age of Onset  . Heart disease Mother    Social History:  reports that she has been smoking Cigarettes.  She has a 45 pack-year smoking history. She has never used smokeless tobacco. She reports that she drinks alcohol. She reports that she does not use illicit drugs.  Allergies:  Allergies  Allergen Reactions  . Penicillins Other (See Comments)    unknown     (Not in a hospital admission)  Results for orders placed during the hospital encounter of 07/05/14 (from the past 48 hour(s))  APTT     Status: None   Collection Time    07/05/14  9:18 AM      Result Value Ref Range   aPTT 28  24 - 37 seconds  CBC     Status: Abnormal   Collection Time    07/05/14  9:18 AM      Result Value Ref Range   WBC 14.4 (*) 4.0 - 10.5 K/uL   RBC 4.65  3.87 - 5.11 MIL/uL   Hemoglobin 14.4  12.0 - 15.0 g/dL   HCT 42.9  36.0 - 46.0 %   MCV 92.3  78.0 - 100.0 fL   MCH 31.0  26.0 - 34.0 pg   MCHC 33.6  30.0 - 36.0 g/dL   RDW 13.9  11.5 - 15.5 %   Platelets 299  150 - 400 K/uL  PROTIME-INR     Status: None   Collection Time    07/05/14  9:18 AM      Result  Value Ref Range   Prothrombin Time 13.6  11.6 - 15.2 seconds   INR 1.04  0.00 - 1.49   Nm Pet Image Initial (pi) Skull Base To Thigh  07/03/2014   CLINICAL DATA:  Initial treatment strategy for suspected stage IV lung cancer with brain metastases. Recent left adrenal biopsy demonstrated an adenoma.  EXAM: NUCLEAR MEDICINE PET SKULL BASE TO THIGH  TECHNIQUE: 8.4 mCi F-18 FDG was injected intravenously. Full-ring PET imaging was performed from the skull base to thigh after the radiotracer. CT data was obtained and used for attenuation correction and anatomic localization.  FASTING BLOOD GLUCOSE:  Value: 103 mg/dl  COMPARISON:  CTs of the chest, abdomen and pelvis dated 06/18/2014. Brain MRI 06/25/2014.  FINDINGS: NECK  The known intracranial metastatic disease is not well demonstrated. However, there is hypermetabolic tumor involving the left temporal bone with extra osseous extension into the infratemporal fossa. There is a probable hypermetabolic node within the left parotid gland with an SUV max of 5.9 on  image 26. A 1.4 cm right supraclavicular node on image 42 has an SUV max of 8.0.There are no lesions of the pharyngeal mucosal space. There is a small hypermetabolic right thyroid nodule.  CHEST  The right perihilar mass is hypermetabolic with an SUV max of 7.7. There is extensive peripheral hypermetabolic activity within the confluent right hilar and mediastinal lymphadenopathy. This demonstrates an SUV max of 10.3. There are additional small hypermetabolic lymph nodes within the right axilla, superior mediastinum and AP window. The small left lower lobe pulmonary nodule does not show clear hypermetabolic activity, although is too small to optimally characterize.  ABDOMEN/PELVIS  There is no hypermetabolic activity within the liver, adrenal glands or spleen. The biopsied left adrenal nodule measures -8 HU consistent with an adenoma. There are several hypermetabolic nodules within the pancreas. One inferiorly  in the uncinate process has an SUV max of 13.7. There are small hypermetabolic nodules within the pancreatic tail. Horseshoe kidney is noted. There is no hypermetabolic nodal activity. However, there are hypermetabolic adnexal lesions bilaterally (SUV max 9.6 on the right and 14.1 on the left).  SKELETON  There is prominent activity associated with the right humeral neck. This demonstrates an SUV max of 13.7. There is a fracture of the humeral neck which is potentially pathologic. There is bandlike hypermetabolic activity associated with the anterior aspect of the right fifth rib. This corresponds with adjacent soft tissue and is probably a metastasis. This has an SUV max of the 11.7. No spinal lesions demonstrated.  IMPRESSION: 1. Extensive metastatic disease from suspected primary bronchogenic carcinoma in the right upper lobe. There are multiple hypermetabolic lymph nodes within the left parotid gland, right supraclavicular station, right hilum and mediastinum. In addition, there is evidence of metastatic disease to the left infratemporal fossa, the pancreas and possibly both ovaries. 2. Osseous metastasis of the right fifth rib. Possible metastasis with pathologic fracture of the proximal right humerus. Plain film follow-up recommended.   Electronically Signed   By: Camie Patience M.D.   On: 07/03/2014 13:19    Review of Systems  Constitutional: Negative for fever and weight loss.  Respiratory: Negative for shortness of breath.   Cardiovascular: Negative for chest pain.  Gastrointestinal: Negative for nausea, vomiting and abdominal pain.  Genitourinary: Negative for hematuria.  Musculoskeletal: Negative for neck pain.  Neurological: Negative for weakness and headaches.    Blood pressure 168/87, pulse 63, temperature 97.9 F (36.6 C), temperature source Oral, resp. rate 18, height 5\' 3"  (1.6 m), weight 73.483 kg (162 lb), SpO2 99.00%. Physical Exam  Constitutional: She is oriented to person, place,  and time. She appears well-nourished.  Cardiovascular: Normal rate and regular rhythm.   No murmur heard. Respiratory: Effort normal and breath sounds normal. She has no wheezes.  GI: Soft. There is no tenderness.  Musculoskeletal: Normal range of motion.  Neurological: She is alert and oriented to person, place, and time.  Skin: Skin is warm and dry.  Psychiatric: She has a normal mood and affect. Her behavior is normal. Judgment and thought content normal.     Assessment/Plan Presumed stage IV lung ca Brain mets Many +PET areas Bx adrenal mass 8/12: adenoma Scheduled now for R SCLN biopsy Pt and son aware of procedure benefits and risks and agreeable to proceed Consent signed andin chart  Shealeigh Dunstan A 07/05/2014, 10:06 AM

## 2014-07-08 ENCOUNTER — Encounter: Payer: Self-pay | Admitting: Radiation Oncology

## 2014-07-08 ENCOUNTER — Ambulatory Visit: Payer: Medicare HMO | Admitting: Radiation Oncology

## 2014-07-08 NOTE — Progress Notes (Signed)
Patient presented today for 0800 srs treatment that had been cancelled. Explained to the patient the treatment had been cancelled while awaiting biopsy and PET results. Patient verbalized understanding. Patient stated, "I figured as much but, wasn't sure." Explained staff will contact her with treatment appointment date and time once result are back. She verbalized understanding.

## 2014-07-09 ENCOUNTER — Encounter: Payer: Self-pay | Admitting: Internal Medicine

## 2014-07-09 ENCOUNTER — Encounter: Payer: Self-pay | Admitting: *Deleted

## 2014-07-09 NOTE — Progress Notes (Signed)
[facility] Psychosocial Distress Screening Clinical Social Work  Clinical Social Work was referred by distress screening protocol.  The patient scored a 7 on the Psychosocial Distress Thermometer which indicates moderate distress. Clinical Social Worker attempted to contact patient by phone to assess for distress and other psychosocial needs.   ONCBCN DISTRESS SCREENING 06/24/2014  Screening Type Initial Screening  Mark the number that describes how much distress you have been experiencing in the past week 7  Emotional problem type Adjusting to illness  Physical Problem type Pain  Physician notified of physical symptoms Yes  Referral to clinical social work Yes    Clinical Social Worker follow up needed: Yes.    If yes, follow up plan:  CSW left voicemail for patient to return call.  CSW will await patient call.  Polo Riley, MSW, LCSW, OSW-C Clinical Social Worker [facility] [phone]

## 2014-07-10 ENCOUNTER — Institutional Professional Consult (permissible substitution): Payer: Medicare HMO | Admitting: Internal Medicine

## 2014-07-10 ENCOUNTER — Ambulatory Visit: Payer: Medicare HMO | Admitting: Radiation Oncology

## 2014-07-10 ENCOUNTER — Other Ambulatory Visit: Payer: Self-pay | Admitting: Radiation Oncology

## 2014-07-12 ENCOUNTER — Telehealth: Payer: Self-pay | Admitting: *Deleted

## 2014-07-12 DIAGNOSIS — C7931 Secondary malignant neoplasm of brain: Secondary | ICD-10-CM | POA: Diagnosis not present

## 2014-07-12 NOTE — Telephone Encounter (Signed)
Patient called asking when she was going to have radiation, she was cancelled she stated and didn't know why, , will get with Dr.Moody and will call her back, saw Aldona Bar Presnell's Rn  note from 8/31./15 treatment cancelled while awaiting biopsy and pet results,

## 2014-07-16 ENCOUNTER — Ambulatory Visit
Admission: RE | Admit: 2014-07-16 | Discharge: 2014-07-16 | Disposition: A | Discharge status: Home / Self Care | Payer: Medicare HMO | Source: Ambulatory Visit | Attending: Radiation Oncology | Admitting: Radiation Oncology

## 2014-07-16 ENCOUNTER — Encounter: Payer: Self-pay | Admitting: Radiation Oncology

## 2014-07-16 DIAGNOSIS — C7931 Secondary malignant neoplasm of brain: Secondary | ICD-10-CM | POA: Diagnosis not present

## 2014-07-16 DIAGNOSIS — C3492 Malignant neoplasm of unspecified part of left bronchus or lung: Secondary | ICD-10-CM

## 2014-07-16 DIAGNOSIS — C7949 Secondary malignant neoplasm of other parts of nervous system: Secondary | ICD-10-CM | POA: Diagnosis not present

## 2014-07-16 DIAGNOSIS — C7952 Secondary malignant neoplasm of bone marrow: Secondary | ICD-10-CM

## 2014-07-16 DIAGNOSIS — C7951 Secondary malignant neoplasm of bone: Secondary | ICD-10-CM

## 2014-07-16 NOTE — Progress Notes (Signed)
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     SIDE: right   CONSENT VERIFIED: yes   SET UP AND IMMOBILIZATION: Patient is setup supine with arms down. A head rest is used foimmobilizaiton as she could not bring her right arm up due to fracture.    NARRATIVE: The patient was brought to the [facility].  Identity was confirmed.  All relevant records and images related to the planned course of therapy were reviewed.  Then, the patient was positioned in a stable reproducible clinical set-up for radiation therapy.  CT images were obtained.  Skin markings were placed first on the right shoulder and then in the chest.  The CT images were loaded into the planning software where the target and avoidance structures were contoured.  The radiation prescription was entered and confirmed.   TREATMENT PLANNING NOTE:  Treatment planning then occurred. I have requested 3D simulation with [facility] of the spinal cord, total lungs and gross tumor volume. I have also requested mlcs and an isodose plan.

## 2014-07-16 NOTE — Progress Notes (Signed)
I met with the patient and her son today prior to whole brain treatment.  We discussed the nature of small cell cancer and the likelihood of micormetastatic disease within the brain. We discussed Dr. Ida Rogue recommendations to treat the shoulder and chest. We discussed her MRI tomorrow and the importance of undergoing that to determine if she needed surgical stabilization. We discussed our goal of treatment was non-curative- that small cell could be treated but not cured and likely there was more disease in her body that what we saw on the PET scan. We discussed the side effects of whole brain treatment including but not limited to hair loss, fatigue, and memory loss. We discussed dysphagia and damage to critical normal structures within the chest as a result of her chest radiation. I gave her a copy of all of the consent forms. She will follow up with Dr. Alen Blew tomorrow.

## 2014-07-17 ENCOUNTER — Telehealth: Payer: Self-pay | Admitting: Oncology

## 2014-07-17 ENCOUNTER — Ambulatory Visit
Admission: RE | Admit: 2014-07-17 | Discharge: 2014-07-17 | Disposition: A | Discharge status: Home / Self Care | Payer: Medicare HMO | Source: Ambulatory Visit | Attending: Radiation Oncology | Admitting: Radiation Oncology

## 2014-07-17 ENCOUNTER — Encounter: Payer: Self-pay | Admitting: Oncology

## 2014-07-17 ENCOUNTER — Ambulatory Visit (HOSPITAL_BASED_OUTPATIENT_CLINIC_OR_DEPARTMENT_OTHER): Payer: Medicare HMO | Admitting: Oncology

## 2014-07-17 VITALS — BP 154/66 | HR 69 | Temp 97.9°F | Resp 19 | Ht 63.0 in | Wt 165.7 lb

## 2014-07-17 DIAGNOSIS — C3400 Malignant neoplasm of unspecified main bronchus: Secondary | ICD-10-CM

## 2014-07-17 DIAGNOSIS — C7931 Secondary malignant neoplasm of brain: Secondary | ICD-10-CM | POA: Diagnosis not present

## 2014-07-17 DIAGNOSIS — R222 Localized swelling, mass and lump, trunk: Secondary | ICD-10-CM

## 2014-07-17 DIAGNOSIS — C3490 Malignant neoplasm of unspecified part of unspecified bronchus or lung: Secondary | ICD-10-CM | POA: Insufficient documentation

## 2014-07-17 DIAGNOSIS — C7949 Secondary malignant neoplasm of other parts of nervous system: Secondary | ICD-10-CM

## 2014-07-17 DIAGNOSIS — M8448XA Pathological fracture, other site, initial encounter for fracture: Secondary | ICD-10-CM

## 2014-07-17 MED ORDER — RADIAPLEXRX EX GEL
Freq: Once | CUTANEOUS | Status: AC
  Administered 2014-07-17: 11:00:00 via TOPICAL

## 2014-07-17 NOTE — Progress Notes (Signed)
Hematology and Oncology Follow Up Visit  Kristina Lawson [id number] 29-May-1946 68 y.o. 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 She presented with seizure and left sided weakness. In August of 2015. She underwent staging studies which showed a mass in the right lung measuring 2.6 x 2.3 cm in the right upper lobe as well as lymphadenopathy. This was biopsy proven to be poorly differentiated cancer with neuroendocrine features. 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 presents today for a followup visit with her son. Since her last visit, she has been doing very well without any further seizure activity. She has not reported any headaches or blurry vision did not report any syncope. She does report right upper extremity weakness which showed to have a pathological fracture and cavity being evaluated by orthopedics. She will be receiving radiation therapy as well.. She continues to be on dexamethasone and Keppra. She does report any cough or shortness of breath. Desirable any wheezing or hemoptysis. She does not report any chest pain or palpitation. Desirable any nausea or vomiting or abdominal pain. She does not report any hematochezia or melena. Does not report any urinary complaints. Rest of her review of systems unremarkable.   Medications: I have reviewed the patient's current medications.  Current Outpatient Prescriptions  Medication Sig Dispense Refill  . bisoprolol (ZEBETA) 5 MG tablet Take 1 tablet (5 mg total) by mouth daily.  30 tablet  0  . Cholecalciferol (VITAMIN D) 2000 UNITS tablet Take 2,000 Units by mouth daily.      Marland Kitchen dexamethasone (DECADRON) 4 MG tablet Take 1 tablet (4 mg total) by mouth 2 (two) times daily with a meal.  60  tablet  0  . levETIRAcetam (KEPPRA) 500 MG tablet Take 1 tablet (500 mg total) by mouth 2 (two) times daily.  60 tablet  0  . LORazepam (ATIVAN) 0.5 MG tablet Take 1 tablet (0.5 mg total) by mouth once as needed for anxiety. Take 30 min prior to treatment for anxiety.  5 tablet  0  . Omega-3 Fatty Acids (FISH OIL) 1200 MG CAPS Take 2,400 mg by mouth daily.       . hyaluronate sodium (RADIAPLEXRX) GEL Apply 1 application topically 2 (two) times daily. Apply to chest,shoulder,& brain areas where radiation is treated daily  And bedtime      . HYDROcodone-acetaminophen (NORCO/VICODIN) 5-325 MG per tablet        No current facility-administered medications for this visit.     Allergies:  Allergies  Allergen Reactions  . Penicillins Other (See Comments)    unknown    Past Medical History, Surgical history, Social history, and Family History were reviewed and updated.   Physical Exam: Blood pressure 154/66, pulse 69, temperature 97.9 F (36.6 C), temperature source Oral, resp. rate 19, height 5\' 3"  (1.6 m), weight 165 lb 11.2 oz (75.161 kg). ECOG: 0 General appearance: alert and cooperative Head: Normocephalic, without obvious abnormality Neck: no adenopathy Lymph nodes: Cervical, supraclavicular, and axillary nodes normal. Heart:regular rate and rhythm, S1, S2 normal, no murmur, click, rub or gallop Lung:chest clear, no wheezing, rales, normal symmetric air entry Abdomin: soft, non-tender, without masses or organomegaly EXT:no erythema, induration, or nodules   Lab Results: Lab Results  Component Value Date   WBC 14.4* 07/05/2014  HGB 14.4 07/05/2014   HCT 42.9 07/05/2014   MCV 92.3 07/05/2014   PLT 299 07/05/2014     Chemistry      Component Value Date/Time   NA 136* 06/19/2014 0339   K 4.0 06/19/2014 0339   CL 98 06/19/2014 0339   CO2 23 06/19/2014 0339   BUN 12 06/19/2014 0339   CREATININE 0.72 06/19/2014 0339   CREATININE 0.79 02/06/2014 0908      Component Value Date/Time    CALCIUM 9.1 06/19/2014 0339   ALKPHOS 72 06/18/2014 0253   AST 18 06/18/2014 0253   ALT 13 06/18/2014 0253   BILITOT 0.6 06/18/2014 0253          Impression and Plan:   68 year old woman with the following issues:  1. Right lung mass measuring 2.6 x 2.3 centimeters in the right upper lobe. She has chest adenopathy as well as enlarged adrenal gland that has been biopsied which showed benign findings. She is status post a biopsy on 07/05/2014 with the pathology confirmed poorly differentiated tumor likely small cell lung cancer. The natural course of this disease was discussed and she will likely need systemic chemotherapy. The role of systemic chemotherapy would be palliative in attempt to decrease the cancer low and prevent further complications. We plan to schedule this after the conclusion of the radiation therapy.  2. Brain metastasis: MRI from 06/25/2014 was reviewed and showed multiple lesions with a dominant lesion in the right occipital lobe. She is undergoing radiation therapy at this time. I asked her to start a slow taper her dexamethasone.  3. Pathological fracture of the right arm. She will receive radiation therapy to palliate her symptoms. We will discuss palliative therapy in the future date.  4. Followup: Will be on October 1 after the conclusion of radiation therapy to discuss systemic chemotherapy.   [facility], MD 9/9/201510:48 AM

## 2014-07-17 NOTE — Telephone Encounter (Signed)
Pt confirmed ov per 09/09 POF, gave pt AVS......KJ

## 2014-07-17 NOTE — Progress Notes (Signed)
Patient education done, radiation therapy and you book , my business card and radiaplex gel cream given to patient, skin irritation, fatigue, nausea, hair loss on treated areas of brain, chest and rt shoulder, throat changes, difficulty swallowing, pain, coug, sob, poor appetite, may need to eat smaller meals, brat diet, increase protein in diet,stay hydrated,drink plenty high calorie drinks, water, use baby shampoo every 4-5 days, dove soap unscented, radiaplex daily on affected skin irritation,after rad treatments, discoloration ,tanning of skin , went over meds brought, patient can take ativan prn for nausea or nerves,, will see MD/staff Rn this Thursday after tx , usually fridays after txs, and prn, can call for any questions/concerns,  all questions answered,teach back given 11:08 AM

## 2014-07-18 ENCOUNTER — Ambulatory Visit
Admission: RE | Admit: 2014-07-18 | Discharge: 2014-07-18 | Disposition: A | Discharge status: Home / Self Care | Payer: Medicare HMO | Source: Ambulatory Visit | Attending: Radiation Oncology | Admitting: Radiation Oncology

## 2014-07-18 ENCOUNTER — Encounter: Payer: Self-pay | Admitting: Radiation Oncology

## 2014-07-18 ENCOUNTER — Telehealth: Payer: Self-pay | Admitting: *Deleted

## 2014-07-18 VITALS — BP 158/64 | HR 67 | Temp 97.9°F | Ht 63.0 in | Wt 166.3 lb

## 2014-07-18 DIAGNOSIS — C7931 Secondary malignant neoplasm of brain: Secondary | ICD-10-CM

## 2014-07-18 DIAGNOSIS — C7952 Secondary malignant neoplasm of bone marrow: Secondary | ICD-10-CM

## 2014-07-18 DIAGNOSIS — C7951 Secondary malignant neoplasm of bone: Secondary | ICD-10-CM

## 2014-07-18 NOTE — Telephone Encounter (Signed)
Called [facility] [place] [phone], called in and left voice message for Diflucan : 100mg  tabs, take 2 tabs(200mg  total) day 1, then 100mg  tab daily until completed, total 11 tbas, no refill per Dr.john Moody 4:30 PM

## 2014-07-18 NOTE — Progress Notes (Signed)
   Department of Radiation Oncology  Phone:  [phone] Fax:        [phone]  Weekly Treatment Note    Name: Kristina Lawson Date: 07/18/2014 MRN: [id number] DOB: 06-08-1946   Current dose: 9 Gy  Current fraction: 3   MEDICATIONS: Current Outpatient Prescriptions  Medication Sig Dispense Refill  . bisoprolol (ZEBETA) 5 MG tablet Take 1 tablet (5 mg total) by mouth daily.  30 tablet  0  . Cholecalciferol (VITAMIN D) 2000 UNITS tablet Take 2,000 Units by mouth daily.      Marland Kitchen dexamethasone (DECADRON) 4 MG tablet Take 1 tablet (4 mg total) by mouth 2 (two) times daily with a meal.  60 tablet  0  . fluconazole (DIFLUCAN) 100 MG tablet Take 100 mg by mouth daily. Take 2 tabs(200mg  total  ) day 1, then take 1 tab (100mg ) daily until completed, total 11 tabs only no refill per Dr.Kema Santaella      . hyaluronate sodium (RADIAPLEXRX) GEL Apply 1 application topically 2 (two) times daily. Apply to chest,shoulder,& brain areas where radiation is treated daily  And bedtime      . HYDROcodone-acetaminophen (NORCO/VICODIN) 5-325 MG per tablet       . levETIRAcetam (KEPPRA) 500 MG tablet Take 1 tablet (500 mg total) by mouth 2 (two) times daily.  60 tablet  0  . LORazepam (ATIVAN) 0.5 MG tablet Take 1 tablet (0.5 mg total) by mouth once as needed for anxiety. Take 30 min prior to treatment for anxiety.  5 tablet  0  . Omega-3 Fatty Acids (FISH OIL) 1200 MG CAPS Take 2,400 mg by mouth daily.        No current facility-administered medications for this encounter.     ALLERGIES: Penicillins   LABORATORY DATA:  Lab Results  Component Value Date   WBC 14.4* 07/05/2014   HGB 14.4 07/05/2014   HCT 42.9 07/05/2014   MCV 92.3 07/05/2014   PLT 299 07/05/2014   Lab Results  Component Value Date   NA 136* 06/19/2014   K 4.0 06/19/2014   CL 98 06/19/2014   CO2 23 06/19/2014   Lab Results  Component Value Date   ALT 13 06/18/2014   AST 18 06/18/2014   ALKPHOS 72 06/18/2014   BILITOT 0.6 06/18/2014      NARRATIVE: Kristina Lawson was seen today for weekly treatment management. The chart was checked and the patient's films were reviewed. The patient is doing well with treatment. Denies pain. She has begun decreasing her dose of Decadron.  PHYSICAL EXAMINATION: height is 5\' 3"  (1.6 m) and weight is 166 lb 4.8 oz (75.433 kg). Her temperature is 97.9 F (36.6 C). Her blood pressure is 158/64 and her pulse is 67.      thrush present  ASSESSMENT: The patient is doing satisfactorily with treatment.  PLAN: We will continue with the patient's radiation treatment as planned. The patient has been given a prescription for Diflucan.

## 2014-07-18 NOTE — Telephone Encounter (Signed)
Called patient rx diflucan called to [facility] [address], left pharmacy voice message, wait 2 hours and call them see if reqady,patient thanked me for the call 4:34 PM

## 2014-07-18 NOTE — Progress Notes (Signed)
Kristina Lawson denies any pain today and is wearing a sling on her right arm.  Note bruising of her left forearm where she has an IV insertion.  She tapering her Decadron and note large amount of whitish areas on the insides of her cheeks and the back of her throat.  She does not have any sore throat or sensation of acid reflux.

## 2014-07-19 ENCOUNTER — Other Ambulatory Visit: Payer: Self-pay | Admitting: Medical Oncology

## 2014-07-19 ENCOUNTER — Telehealth: Payer: Self-pay | Admitting: Medical Oncology

## 2014-07-19 ENCOUNTER — Ambulatory Visit
Admission: RE | Admit: 2014-07-19 | Discharge: 2014-07-19 | Disposition: A | Discharge status: Home / Self Care | Payer: Medicare HMO | Source: Ambulatory Visit | Attending: Radiation Oncology | Admitting: Radiation Oncology

## 2014-07-19 DIAGNOSIS — C7931 Secondary malignant neoplasm of brain: Secondary | ICD-10-CM | POA: Diagnosis not present

## 2014-07-19 MED ORDER — LEVETIRACETAM 500 MG PO TABS: 500.0000 mg | ORAL_TABLET | Freq: Two times a day (BID) | ORAL | Status: DC

## 2014-07-19 NOTE — Telephone Encounter (Signed)
Per MD, informed patient keppra escribed to her requested pharmacy and the prescription for Zebeta she will need to f/u with her PCP. Patient expressed verbal understanding no further questions at this time.

## 2014-07-19 NOTE — Telephone Encounter (Signed)
Patient in lobby,  handed one of the [facility] a note with medications she needs refilled; Keppra and Zebeta.  LOV 09/09  F/U 10/01 with MD  Will review with MD.

## 2014-07-22 ENCOUNTER — Ambulatory Visit
Admission: RE | Admit: 2014-07-22 | Discharge: 2014-07-22 | Disposition: A | Discharge status: Home / Self Care | Payer: Medicare HMO | Source: Ambulatory Visit | Attending: Radiation Oncology | Admitting: Radiation Oncology

## 2014-07-22 DIAGNOSIS — C7931 Secondary malignant neoplasm of brain: Secondary | ICD-10-CM | POA: Diagnosis not present

## 2014-07-22 DIAGNOSIS — C7949 Secondary malignant neoplasm of other parts of nervous system: Secondary | ICD-10-CM | POA: Diagnosis not present

## 2014-07-23 ENCOUNTER — Ambulatory Visit
Admission: RE | Admit: 2014-07-23 | Discharge: 2014-07-23 | Disposition: A | Discharge status: Home / Self Care | Payer: Medicare HMO | Source: Ambulatory Visit | Attending: Radiation Oncology | Admitting: Radiation Oncology

## 2014-07-23 ENCOUNTER — Telehealth: Payer: Self-pay | Admitting: Family Medicine

## 2014-07-23 DIAGNOSIS — C7931 Secondary malignant neoplasm of brain: Secondary | ICD-10-CM | POA: Diagnosis not present

## 2014-07-23 DIAGNOSIS — C7949 Secondary malignant neoplasm of other parts of nervous system: Secondary | ICD-10-CM | POA: Diagnosis not present

## 2014-07-23 MED ORDER — BISOPROLOL FUMARATE 5 MG PO TABS: 5.0000 mg | ORAL_TABLET | Freq: Every day | ORAL | Status: AC

## 2014-07-23 NOTE — Telephone Encounter (Signed)
Patient calling to ask about her bisoprolol said the pharmacy faxed and they have not heard anything about it  [phone]

## 2014-07-23 NOTE — Telephone Encounter (Signed)
Received fax just this AM from [place] sent to pharm

## 2014-07-24 ENCOUNTER — Ambulatory Visit
Admission: RE | Admit: 2014-07-24 | Discharge: 2014-07-24 | Disposition: A | Discharge status: Home / Self Care | Payer: Medicare HMO | Source: Ambulatory Visit | Attending: Radiation Oncology | Admitting: Radiation Oncology

## 2014-07-24 DIAGNOSIS — C7949 Secondary malignant neoplasm of other parts of nervous system: Secondary | ICD-10-CM | POA: Diagnosis not present

## 2014-07-24 DIAGNOSIS — C7931 Secondary malignant neoplasm of brain: Secondary | ICD-10-CM | POA: Diagnosis not present

## 2014-07-25 ENCOUNTER — Ambulatory Visit
Admission: RE | Admit: 2014-07-25 | Discharge: 2014-07-25 | Disposition: A | Discharge status: Home / Self Care | Payer: Medicare HMO | Source: Ambulatory Visit | Attending: Radiation Oncology | Admitting: Radiation Oncology

## 2014-07-25 DIAGNOSIS — C7931 Secondary malignant neoplasm of brain: Secondary | ICD-10-CM | POA: Diagnosis not present

## 2014-07-26 ENCOUNTER — Encounter: Payer: Self-pay | Admitting: Radiation Oncology

## 2014-07-26 ENCOUNTER — Ambulatory Visit
Admission: RE | Admit: 2014-07-26 | Discharge: 2014-07-26 | Disposition: A | Discharge status: Home / Self Care | Payer: Medicare HMO | Source: Ambulatory Visit | Attending: Radiation Oncology | Admitting: Radiation Oncology

## 2014-07-26 DIAGNOSIS — C7952 Secondary malignant neoplasm of bone marrow: Secondary | ICD-10-CM

## 2014-07-26 DIAGNOSIS — C7931 Secondary malignant neoplasm of brain: Secondary | ICD-10-CM | POA: Diagnosis not present

## 2014-07-26 DIAGNOSIS — C7951 Secondary malignant neoplasm of bone: Secondary | ICD-10-CM

## 2014-07-26 DIAGNOSIS — C7949 Secondary malignant neoplasm of other parts of nervous system: Secondary | ICD-10-CM | POA: Diagnosis not present

## 2014-07-26 NOTE — Progress Notes (Signed)
   Department of Radiation Oncology  Phone:  [phone] Fax:        [phone]  Weekly Treatment Note    Name: Kristina Lawson Date: 07/26/2014 MRN: [id number] DOB: Jun 26, 1946   Current dose: 27 Gy  Current fraction: 9 (brain)   MEDICATIONS: Current Outpatient Prescriptions  Medication Sig Dispense Refill  . bisoprolol (ZEBETA) 5 MG tablet Take 1 tablet (5 mg total) by mouth daily.  30 tablet  5  . Cholecalciferol (VITAMIN D) 2000 UNITS tablet Take 2,000 Units by mouth daily.      Marland Kitchen dexamethasone (DECADRON) 4 MG tablet Take 1 tablet (4 mg total) by mouth 2 (two) times daily with a meal.  60 tablet  0  . fluconazole (DIFLUCAN) 100 MG tablet Take 100 mg by mouth daily. Take 2 tabs(200mg  total  ) day 1, then take 1 tab (100mg ) daily until completed, total 11 tabs only no refill per Dr.Sota Hetz      . hyaluronate sodium (RADIAPLEXRX) GEL Apply 1 application topically 2 (two) times daily. Apply to chest,shoulder,& brain areas where radiation is treated daily  And bedtime      . HYDROcodone-acetaminophen (NORCO/VICODIN) 5-325 MG per tablet       . levETIRAcetam (KEPPRA) 500 MG tablet Take 1 tablet (500 mg total) by mouth 2 (two) times daily.  60 tablet  0  . LORazepam (ATIVAN) 0.5 MG tablet Take 1 tablet (0.5 mg total) by mouth once as needed for anxiety. Take 30 min prior to treatment for anxiety.  5 tablet  0  . Omega-3 Fatty Acids (FISH OIL) 1200 MG CAPS Take 2,400 mg by mouth daily.        No current facility-administered medications for this encounter.     ALLERGIES: Penicillins   LABORATORY DATA:  Lab Results  Component Value Date   WBC 14.4* 07/05/2014   HGB 14.4 07/05/2014   HCT 42.9 07/05/2014   MCV 92.3 07/05/2014   PLT 299 07/05/2014   Lab Results  Component Value Date   NA 136* 06/19/2014   K 4.0 06/19/2014   CL 98 06/19/2014   CO2 23 06/19/2014   Lab Results  Component Value Date   ALT 13 06/18/2014   AST 18 06/18/2014   ALKPHOS 72 06/18/2014   BILITOT 0.6  06/18/2014     NARRATIVE: Kristina Lawson was seen today for weekly treatment management. The chart was checked and the patient's films were reviewed. The patient is doing well with her treatment. She has been tapering her steroids according to instructions from medical oncology.  PHYSICAL EXAMINATION: vitals were not taken for this visit.     no thrush present  ASSESSMENT: The patient is doing satisfactorily with treatment.  PLAN: We will continue with the patient's radiation treatment as planned. The patient will finish her palliative treatment to 3 sites next week. I will then see her in one month. The patient is scheduled to see medical oncology in the near future to further discuss possible palliative chemotherapy.

## 2014-07-26 NOTE — Progress Notes (Signed)
Weekly rad txs  Brain,(9/10) chest (7/10),rt humerus, (7/10) treatments completed,, taking , keppra, and diflucan  100mg  daily started on 07/18/14, for thrush should completed 07/18/19/15, , patient has sling in right arm,/w/c, using radiaplex gel,  Stopped taking Decadron per patient tapered and could stop per Dr. Alen Blew, no nausea, no pain except right arm, no thrush seen on tongue of pateint, appetite good, still has moon face, blurred vision at times, no head aches, enetrgy level some better 3:17 PM'

## 2014-07-29 ENCOUNTER — Ambulatory Visit
Admission: RE | Admit: 2014-07-29 | Discharge: 2014-07-29 | Disposition: A | Discharge status: Home / Self Care | Payer: Medicare HMO | Source: Ambulatory Visit | Attending: Radiation Oncology | Admitting: Radiation Oncology

## 2014-07-29 DIAGNOSIS — C7931 Secondary malignant neoplasm of brain: Secondary | ICD-10-CM | POA: Diagnosis not present

## 2014-07-29 DIAGNOSIS — C7949 Secondary malignant neoplasm of other parts of nervous system: Secondary | ICD-10-CM | POA: Diagnosis not present

## 2014-07-30 ENCOUNTER — Ambulatory Visit
Admission: RE | Admit: 2014-07-30 | Discharge: 2014-07-30 | Disposition: A | Discharge status: Home / Self Care | Payer: Medicare HMO | Source: Ambulatory Visit | Attending: Radiation Oncology | Admitting: Radiation Oncology

## 2014-07-30 ENCOUNTER — Ambulatory Visit: Payer: Medicare HMO

## 2014-07-30 DIAGNOSIS — C7949 Secondary malignant neoplasm of other parts of nervous system: Secondary | ICD-10-CM | POA: Diagnosis not present

## 2014-07-30 DIAGNOSIS — C7931 Secondary malignant neoplasm of brain: Secondary | ICD-10-CM | POA: Diagnosis not present

## 2014-07-31 ENCOUNTER — Encounter: Payer: Self-pay | Admitting: Radiation Oncology

## 2014-07-31 ENCOUNTER — Encounter: Payer: Self-pay | Admitting: *Deleted

## 2014-07-31 ENCOUNTER — Ambulatory Visit
Admission: RE | Admit: 2014-07-31 | Discharge: 2014-07-31 | Disposition: A | Discharge status: Home / Self Care | Payer: Medicare HMO | Source: Ambulatory Visit | Attending: Radiation Oncology | Admitting: Radiation Oncology

## 2014-07-31 ENCOUNTER — Ambulatory Visit: Payer: Medicare HMO

## 2014-07-31 DIAGNOSIS — C7949 Secondary malignant neoplasm of other parts of nervous system: Secondary | ICD-10-CM | POA: Diagnosis not present

## 2014-07-31 DIAGNOSIS — C7931 Secondary malignant neoplasm of brain: Secondary | ICD-10-CM | POA: Diagnosis not present

## 2014-07-31 NOTE — Progress Notes (Signed)
Kristina Lawson  Clinical Social Lawson was referred by P[employer] scheme manager for assessment of psychosocial needs.  Clinical Social Worker contacted patient by phone to offer support and assess for needs.  Kristina Lawson reports she is doing well and is excited today is her last radiation treatment.  The patient has no concerns at this time, but states "I really hope I don't have to do chemo".  Kristina Lawson is scheduled to meet with medical oncologist next week.  CSW provided information regarding support services and encouraged patient to call with any questions or concerns.  Polo Riley, MSW, LCSW, OSW-C Clinical Social Worker [facility] [phone]

## 2014-07-31 NOTE — Progress Notes (Signed)
Patient completed tx brain, rt humerus ,shoulder ,chest 10/10 , no c/o pain, nausea, blurred vision, no coughing, appetite good, in great spirits 3:03 PM

## 2014-08-01 ENCOUNTER — Ambulatory Visit: Payer: Medicare HMO

## 2014-08-02 ENCOUNTER — Ambulatory Visit: Payer: Medicare HMO

## 2014-08-05 ENCOUNTER — Ambulatory Visit: Payer: Medicare HMO

## 2014-08-06 ENCOUNTER — Ambulatory Visit: Payer: Medicare HMO

## 2014-08-07 ENCOUNTER — Ambulatory Visit: Payer: Medicare HMO

## 2014-08-08 ENCOUNTER — Ambulatory Visit (HOSPITAL_BASED_OUTPATIENT_CLINIC_OR_DEPARTMENT_OTHER): Payer: Medicare HMO | Admitting: Oncology

## 2014-08-08 ENCOUNTER — Telehealth: Payer: Self-pay | Admitting: Oncology

## 2014-08-08 ENCOUNTER — Encounter: Payer: Self-pay | Admitting: Oncology

## 2014-08-08 VITALS — BP 163/77 | HR 91 | Temp 97.9°F | Resp 19 | Ht 63.0 in | Wt 158.4 lb

## 2014-08-08 DIAGNOSIS — C3411 Malignant neoplasm of upper lobe, right bronchus or lung: Secondary | ICD-10-CM

## 2014-08-08 DIAGNOSIS — C3492 Malignant neoplasm of unspecified part of left bronchus or lung: Secondary | ICD-10-CM

## 2014-08-08 DIAGNOSIS — C7931 Secondary malignant neoplasm of brain: Secondary | ICD-10-CM

## 2014-08-08 NOTE — Telephone Encounter (Signed)
gv adn printed appt sched and avs fo rpt for NOV

## 2014-08-08 NOTE — Progress Notes (Signed)
Hematology and Oncology Follow Up Visit  Kristina Lawson [id number] [birth year] 68 y.o. 08/08/2014 4:23 PM PICKARD,WARREN TOM, MDPickard, Cammie Mcgee, MD   Principle Diagnosis: 68 year old with stage IV lung cancer. She presented with multiple brain metastasis. She presented with seizure and left sided weakness. In August of 2015. She underwent staging studies which showed a mass in the right lung measuring 2.6 x 2.3 cm in the right upper lobe as well as lymphadenopathy. This was biopsy proven to be poorly differentiated cancer with neuroendocrine features. 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  Current therapy: Under consideration for systemic therapy.  Interim History:  Ms. Kramm presents today for a followup visit with her son. Since her last visit, she has completed radiation therapy without any further complications. She denied seizure activity or any other neurological symptoms. She has not reported any headaches or blurry vision did not report any syncope. She does report right upper extremity weakness which showed to have a pathological fracture and continues to be in a sling at this time. She is on dexamethasone but continues to be on Keppra. She is reporting some slight fatigue and slight decline in her appetite. She continues to have a reasonable performance status. She does report any cough or shortness of breath. Desirable any wheezing or hemoptysis. She does not report any chest pain or palpitation. Desirable any nausea or vomiting or abdominal pain. She does not report any hematochezia or melena. Does not report any urinary complaints. Rest of her review of systems unremarkable.   Medications: I have reviewed the patient's current medications.  Current Outpatient Prescriptions  Medication Sig Dispense Refill  . bisoprolol (ZEBETA) 5 MG  tablet Take 1 tablet (5 mg total) by mouth daily.  30 tablet  5  . Cholecalciferol (VITAMIN D) 2000 UNITS tablet Take 2,000 Units by mouth daily.      . fluconazole (DIFLUCAN) 100 MG tablet Take 100 mg by mouth daily. Take 2 tabs(200mg  total  ) day 1, then take 1 tab (100mg ) daily until completed, total 11 tabs only no refill per Dr.Moody      . hyaluronate sodium (RADIAPLEXRX) GEL Apply 1 application topically 2 (two) times daily. Apply to chest,shoulder,& brain areas where radiation is treated daily  And bedtime      . HYDROcodone-acetaminophen (NORCO/VICODIN) 5-325 MG per tablet       . levETIRAcetam (KEPPRA) 500 MG tablet Take 1 tablet (500 mg total) by mouth 2 (two) times daily.  60 tablet  0  . LORazepam (ATIVAN) 0.5 MG tablet Take 1 tablet (0.5 mg total) by mouth once as needed for anxiety. Take 30 min prior to treatment for anxiety.  5 tablet  0  . Omega-3 Fatty Acids (FISH OIL) 1200 MG CAPS Take 2,400 mg by mouth daily.        No current facility-administered medications for this visit.     Allergies:  Allergies  Allergen Reactions  . Penicillins Other (See Comments)    unknown    Past Medical History, Surgical history, Social history, and Family History were reviewed and updated.   Physical Exam: Blood pressure 163/77, pulse 91, temperature 97.9 F (36.6 C), temperature source Oral, resp. rate 19, height 5\' 3"  (1.6 m), weight 158 lb 6.4 oz (71.85 kg), SpO2 94.00%. ECOG: 1 General appearance: alert and cooperative. Not in any distress. Head: Normocephalic, without obvious abnormality  Neck: no adenopathy Lymph nodes: Cervical, supraclavicular, and axillary nodes normal. Heart:regular rate and rhythm, S1, S2 normal, no murmur, click, rub or gallop Lung:chest clear, no wheezing, rales, normal symmetric air entry Abdomin: soft, non-tender, without masses or organomegaly EXT:no erythema, induration, or nodules Neurological: No deficits.  Lab Results: Lab Results  Component  Value Date   WBC 14.4* 07/05/2014   HGB 14.4 07/05/2014   HCT 42.9 07/05/2014   MCV 92.3 07/05/2014   PLT 299 07/05/2014     Chemistry      Component Value Date/Time   NA 136* 06/19/2014 0339   K 4.0 06/19/2014 0339   CL 98 06/19/2014 0339   CO2 23 06/19/2014 0339   BUN 12 06/19/2014 0339   CREATININE 0.72 06/19/2014 0339   CREATININE 0.79 02/06/2014 0908      Component Value Date/Time   CALCIUM 9.1 06/19/2014 0339   ALKPHOS 72 06/18/2014 0253   AST 18 06/18/2014 0253   ALT 13 06/18/2014 0253   BILITOT 0.6 06/18/2014 0253          Impression and Plan:   68 year old woman with the following issues:  1. Right lung mass measuring 2.6 x 2.3 centimeters in the right upper lobe. She has chest adenopathy as well as enlarged adrenal gland that has been biopsied which showed benign findings. She is status post a biopsy on 07/05/2014 with the pathology confirmed poorly differentiated tumor likely small cell lung cancer. The natural course of this disease was discussed again today and the role of systemic chemotherapy was discussed extensively. The risks and benefits of multi-agent systemic chemotherapy was discussed today. Complications and side effects and includes nausea, vomiting, myelosuppression, neutropenia, neutropenic sepsis as well as the need for a Port-A-Cath insertion was discussed today. The benefit from chemotherapy would be purely palliative at this time. Given the squamous differentiation of her tumor the response to systemic chemotherapy will probably be blunted. She understand regardless of the response to chemotherapy, she has limited life expectancy and the months rather than years.  She is not quite sure if she was to proceed with chemotherapy given the limited life expectancy and the limited benefit but comes with that. I explained to her that chemotherapy might offer palliation of her symptoms and improvement in her quality of life. She will discuss this further with her family and  will let me know in the future.  2. Brain metastasis: MRI from 06/25/2014 was reviewed and showed multiple lesions with a dominant lesion in the right occipital lobe. She is status post radiation therapy that have been completed.   3. Pathological fracture of the right arm. Pain is much improved after radiation therapy.  4. Followup: Will be in the next few weeks sooner if she decides on systemic chemotherapy.   UUVOZD,GUYQI, MD 10/1/20154:23 PM

## 2014-08-12 ENCOUNTER — Ambulatory Visit: Payer: Medicare HMO | Admitting: Radiation Oncology

## 2014-08-19 ENCOUNTER — Telehealth: Payer: Self-pay | Admitting: Medical Oncology

## 2014-08-19 MED ORDER — LEVETIRACETAM 500 MG PO TABS: 500.0000 mg | ORAL_TABLET | Freq: Two times a day (BID) | ORAL | Status: DC

## 2014-08-19 NOTE — Telephone Encounter (Signed)
Patient inquiring as to need for continuation of Keppra, states she ran out yesterday, asking about refill. Reviewed with PA, Awilda Metro, ok to refill.   LVMOM with patient of refill e-scribed to her listed pharmacy. Patient to call office with questions/concerns.  MD inboxed.

## 2014-08-23 NOTE — Progress Notes (Addendum)
  Radiation Oncology         [phone] ________________________________  Name: Kristina Lawson MRN: [id number]  Date: 07/31/2014  DOB: December 05, 1945  End of Treatment Note  Diagnosis:    Lung cancer   Primary site: Lung   Staging method: AJCC 7th Edition   Clinical: Stage IV ([place], NX, M1) signed by Wyatt Portela, MD on 07/17/2014 10:48 AM   Summary: Stage IV ([place], NX, M1)    Indication for treatment:  Palliative       Radiation treatment dates:   07/16/2014 through 07/31/2014  Site/dose:    1. Brain/left base of skulland infratemporal tumor: 30 Gy in 10 fractions 2. Chest/mediastinum:  30 Gy in 10 fractions 3. proximal right humerus:  30 Gy in 10 fractions  Narrative:  The radiation treatment relatively well.   The patient was able to complete her prescribed course of treatment without substantial delay or difficulty.  Plan: The patient has completed radiation treatment. The patient will return to radiation oncology clinic for routine followup in one month. I advised the patient to call or return sooner if they have any questions or concerns related to their recovery or treatment. ________________________________  Jodelle Gross, M.D., Ph.D.

## 2014-08-23 NOTE — Progress Notes (Signed)
  Radiation Oncology         [phone] ________________________________  Name: Kristina Lawson MRN: [id number]  Date: 07/16/2014  DOB: January 16, 1946  SIMULATION AND TREATMENT PLANNING NOTE  DIAGNOSIS:   Lung cancer   Primary site: Lung   Staging method: AJCC 7th Edition   Clinical: Stage IV ([place], NX, M1) signed by Wyatt Portela, MD on 07/17/2014 10:48 AM   Summary: Stage IV ([place], NX, M1)    Site:   1. Chest/mediastinum 2. proximal right humerus  NARRATIVE:  The patient was brought to the [facility].  Identity was confirmed.  All relevant records and images related to the planned course of therapy were reviewed.   Written consent to proceed with treatment was confirmed which was freely given after reviewing the details related to the planned course of therapy had been reviewed with the patient.  Then, the patient was set-up in a stable reproducible  supine position for radiation therapy.  CT images were obtained.  Surface markings were placed.    Medically necessary complex treatment device(s) for immobilization:  Customized acucform device.   The CT images were loaded into the planning software.  Then the target and avoidance structures were contoured.  Treatment planning then occurred.  The radiation prescription was entered and confirmed.  A total of 4 complex treatment devices were fabricated which relate to the designed radiation treatment fields: 2 customized fields for each of the 2 separate target areas. Each of these customized fields/ complex treatment devices will be used on a daily basis during the radiation course. I have requested : Isodose Plan.   PLAN:  The patient will receive 30 Gy in 10 fractions to the 2 separate target areas.  ________________________________   Jodelle Gross, MD, PhD

## 2014-08-23 NOTE — Addendum Note (Signed)
Encounter addended by: Marye Round, MD on: 08/23/2014  9:45 AM<BR>     Documentation filed: Notes Section, Visit Diagnoses

## 2014-08-23 NOTE — Addendum Note (Signed)
Encounter addended by: Marye Round, MD on: 08/23/2014  9:43 AM<BR>     Documentation filed: Notes Section, Follow-up Section, LOS Section, Visit Diagnoses

## 2014-08-23 NOTE — Progress Notes (Signed)
  Radiation Oncology         [phone] ________________________________  Name: Kristina Lawson MRN: [id number]  Date: 06/27/2014  DOB: 10-Jun-1946  SIMULATION AND TREATMENT PLANNING NOTE  DIAGNOSIS:   Lung cancer   Primary site: Lung   Staging method: AJCC 7th Edition   Clinical: Stage IV ([place], NX, M1) signed by Wyatt Portela, MD on 07/17/2014 10:48 AM   Summary: Stage IV ([place], NX, M1)   Site:  Brain/left infratemporal tumor  NARRATIVE:  The patient was brought to the [facility].  Identity was confirmed.  All relevant records and images related to the planned course of therapy were reviewed.   Written consent to proceed with treatment was confirmed which was freely given after reviewing the details related to the planned course of therapy had been reviewed with the patient.  Then, the patient was set-up in a stable reproducible  supine position for radiation therapy.  CT images were obtained.  Surface markings were placed.    Medically necessary complex treatment device(s) for immobilization:  Customized thermoplastic head cast.   The CT images were loaded into the planning software.  Then the target and avoidance structures were contoured.  Treatment planning then occurred.  The radiation prescription was entered and confirmed.  A total of 2 complex treatment devices were fabricated which relate to the designed radiation treatment fields. Each of these customized fields/ complex treatment devices will be used on a daily basis during the radiation course. I have requested : 3D Simulation  I have requested a DVH of the following structures: CTV, PTV, eyes bilaterally, lenses bilaterally, brain. This is medically necessary due to the patient's extent of tumor which extends extracranially on the left and the base of skull region.   PLAN:  The patient will receive 30 Gy in 10 fractions.  ________________________________   Jodelle Gross, MD, PhD

## 2014-09-05 ENCOUNTER — Ambulatory Visit: Payer: Medicare HMO | Admitting: Radiation Oncology

## 2014-09-09 ENCOUNTER — Encounter: Payer: Self-pay | Admitting: Radiation Oncology

## 2014-09-10 ENCOUNTER — Ambulatory Visit: Payer: Medicare HMO | Admitting: Oncology

## 2014-09-12 ENCOUNTER — Encounter: Payer: Self-pay | Admitting: Radiation Oncology

## 2014-09-12 ENCOUNTER — Ambulatory Visit
Admission: RE | Admit: 2014-09-12 | Discharge: 2014-09-12 | Disposition: A | Discharge status: Home / Self Care | Payer: Medicare HMO | Source: Ambulatory Visit | Attending: Radiation Oncology | Admitting: Radiation Oncology

## 2014-09-12 VITALS — BP 155/72 | HR 91 | Temp 98.3°F | Resp 10 | Wt 155.5 lb

## 2014-09-12 DIAGNOSIS — C7931 Secondary malignant neoplasm of brain: Secondary | ICD-10-CM

## 2014-09-12 HISTORY — DX: Personal history of irradiation: Z92.3

## 2014-09-12 NOTE — Progress Notes (Signed)
She is currently in no pain. Pt complains of, Loss of Sleep, Fatigue and Poor Appetite. She states when food is presented to her she has a disinterest.  Reports drinking 1 can of Boost.   Pt alert & oriented x 3 with fluent speech. Pt reports negative for visual blurring, double vision, eye pain, PERRL, PERRLA, reports have a "muffled" occasional sound in ears. Pt presenting appropriate quality, quantity and organization of sentences. Noted hyperpigmentation -  scalp.

## 2014-09-12 NOTE — Progress Notes (Signed)
  Radiation Oncology         [phone]) [phone] ________________________________  Name: Kristina Lawson MRN: [id number]  Date: 09/12/2014  DOB: September 26, 1946  Follow-Up Visit Note  CC: [name], MD  Wyatt Portela, MD  Diagnosis:   Metastatic lung cancer  Interval Since Last Radiation:  One month   Narrative:  The patient returns today for routine follow-up.  The patient states she has been doing relatively well. Her dominant complaint today is poor appetite. She drinks 1 can of boost per day typically. She had some scalp irritation and lost much of her hair since she was last seen but this has started to regrow. The patient denies any shortness of breath or headaches. No ongoing nausea. She currently is not on steroids. She notes significant improvement in her pain in the right arm.                              ALLERGIES:  is allergic to penicillins.  Meds: Current Outpatient Prescriptions  Medication Sig Dispense Refill  . bisoprolol (ZEBETA) 5 MG tablet Take 1 tablet (5 mg total) by mouth daily. 30 tablet 5  . hyaluronate sodium (RADIAPLEXRX) GEL Apply 1 application topically 2 (two) times daily. Apply to chest,shoulder,& brain areas where radiation is treated daily  And bedtime    . HYDROcodone-acetaminophen (NORCO/VICODIN) 5-325 MG per tablet     . levETIRAcetam (KEPPRA) 500 MG tablet Take 1 tablet (500 mg total) by mouth 2 (two) times daily. 60 tablet 0  . Cholecalciferol (VITAMIN D) 2000 UNITS tablet Take 2,000 Units by mouth daily.    . fluconazole (DIFLUCAN) 100 MG tablet Take 100 mg by mouth daily. Take 2 tabs($RemoveBef'200mg'UTYGLxWlrY$  total  ) day 1, then take 1 tab ($Remo'100mg'Bsvyy$ ) daily until completed, total 11 tabs only no refill per Dr.Sherrelle Prochazka    . LORazepam (ATIVAN) 0.5 MG tablet Take 1 tablet (0.5 mg total) by mouth once as needed for anxiety. Take 30 min prior to treatment for anxiety. 5 tablet 0  . Omega-3 Fatty Acids (FISH OIL) 1200 MG CAPS Take 2,400 mg by mouth daily.      No current  facility-administered medications for this encounter.    Physical Findings: The patient is in no acute distress. Patient is alert and oriented.  weight is 155 lb 8 oz (70.534 kg). Her oral temperature is 98.3 F (36.8 C). Her blood pressure is 155/72 and her pulse is 91. Her respiration is 10 and oxygen saturation is 97%. .     Lab Findings: Lab Results  Component Value Date   WBC 14.4* 07/05/2014   HGB 14.4 07/05/2014   HCT 42.9 07/05/2014   MCV 92.3 07/05/2014   PLT 299 07/05/2014     Radiographic Findings: No results found.  Impression:    The patient clinically is doing reasonably well. We discussed strategies for her to increase her nutritional intake. She has met with medical oncology and she states today that she does not believe she is going to undergo chemotherapy. She prefers to undergo alternative medicine. I discussed with her our typical follow-up which would include a brain MRI scan at 3 months.  Plan:  We will place her on our brain tumor board schedule in 2 months after undergoing a repeat MRI scan of the brain.   Jodelle Gross, M.D., Ph.D.

## 2014-09-13 ENCOUNTER — Other Ambulatory Visit: Payer: Self-pay | Admitting: Radiation Therapy

## 2014-09-13 DIAGNOSIS — C7931 Secondary malignant neoplasm of brain: Secondary | ICD-10-CM

## 2014-10-07 ENCOUNTER — Telehealth: Payer: Self-pay | Admitting: *Deleted

## 2014-10-07 NOTE — Telephone Encounter (Signed)
Patient called asnd asked to schedule follow up appt with Dr. Lisbeth Renshaw this week, asked why as she just saw him 09/12/14, "No appetite and now nausea", asked if Dr.moody coud call in rx for nausea, she stated"I stillwant to se him, transferred call to Earleen Newport, Scheduler 11:49 AM

## 2014-10-09 ENCOUNTER — Telehealth: Payer: Self-pay | Admitting: *Deleted

## 2014-10-09 ENCOUNTER — Ambulatory Visit
Admission: RE | Admit: 2014-10-09 | Discharge: 2014-10-09 | Disposition: A | Discharge status: Home / Self Care | Payer: Medicare HMO | Source: Ambulatory Visit | Attending: Radiation Oncology | Admitting: Radiation Oncology

## 2014-10-09 ENCOUNTER — Encounter: Payer: Self-pay | Admitting: Radiation Oncology

## 2014-10-09 VITALS — BP 134/74 | HR 100 | Temp 97.6°F | Resp 20 | Ht 63.0 in | Wt 145.4 lb

## 2014-10-09 DIAGNOSIS — C7931 Secondary malignant neoplasm of brain: Secondary | ICD-10-CM

## 2014-10-09 MED ORDER — METOCLOPRAMIDE HCL 10 MG PO TABS: 10.0000 mg | ORAL_TABLET | Freq: Four times a day (QID) | ORAL | Status: AC

## 2014-10-09 MED ORDER — NYSTATIN 100000 UNIT/ML MT SUSP: 5.0000 mL | Freq: Four times a day (QID) | OROMUCOSAL | Status: AC

## 2014-10-09 NOTE — Telephone Encounter (Signed)
Kristina Lawson called stating the rxs from Dr.Moody hasn't come in yet at [facility], infomred her I would call, called [facility] on [address], [phone], spoke with Kristina Lawson,pharmacist, he stated he had the nystatin and reglan in comoputer just hasn't been filled or ready yet, thanked him called ms,. [name] back and gave her that information asked her to call pharmacy before driving there 6:86 PM

## 2014-10-09 NOTE — Progress Notes (Signed)
Radiation Oncology         [phone]) [phone] ________________________________  Name: Kristina Lawson MRN: [id number]  Date: 10/09/2014  DOB: Mar 08, 1946  Follow-Up Visit Note  CC: [name], MD  Wyatt Portela, MD  Diagnosis:   Metastatic lung cancer  Narrative:  The patient returns today for routine follow-up.  Follow up  Metastatic lung cancer rad txs 07/16/14-07/31/14  Brain, chest,right humerus, , c/o nausea still, looks like thrush again , no vision changes, shoulder pain prn, says after eating or drinking fluids 1 hour has belching and heartburn, left side of face swollen since past 2.5 weeks, eats a few bites and then gets nauseated, no difficulty swallowing or chewing, patient stopped taking her Keppra on her own, appetite poor past few days  And no energy.                               ALLERGIES:  is allergic to penicillins.  Meds: Current Outpatient Prescriptions  Medication Sig Dispense Refill  . bisoprolol (ZEBETA) 5 MG tablet Take 1 tablet (5 mg total) by mouth daily. 30 tablet 5  . Cholecalciferol (VITAMIN D) 2000 UNITS tablet Take 2,000 Units by mouth daily.    Marland Kitchen HYDROcodone-acetaminophen (NORCO/VICODIN) 5-325 MG per tablet 1 tablet every 6 (six) hours as needed.     . Omega-3 Fatty Acids (FISH OIL) 1200 MG CAPS Take 2,400 mg by mouth daily.     . hyaluronate sodium (RADIAPLEXRX) GEL Apply 1 application topically 2 (two) times daily. Apply to chest,shoulder,& brain areas where radiation is treated daily  And bedtime    . levETIRAcetam (KEPPRA) 500 MG tablet Take 1 tablet (500 mg total) by mouth 2 (two) times daily. (Patient not taking: Reported on 10/09/2014) 60 tablet 0  . levETIRAcetam (KEPPRA) 500 MG tablet Take 500 mg by mouth.    Marland Kitchen LORazepam (ATIVAN) 0.5 MG tablet Take 1 tablet (0.5 mg total) by mouth once as needed for anxiety. Take 30 min prior to treatment for anxiety. (Patient not taking: Reported on 10/09/2014) 5 tablet 0  . metoCLOPramide (REGLAN) 10 MG tablet  Take 1 tablet (10 mg total) by mouth 4 (four) times daily. 60 tablet 1  . nystatin (MYCOSTATIN) 100000 UNIT/ML suspension Take 5 mLs (500,000 Units total) by mouth 4 (four) times daily. 240 mL 0   No current facility-administered medications for this encounter.    Physical Findings: The patient is in no acute distress. Patient is alert and oriented.  height is 5\' 3"  (1.6 m) and weight is 145 lb 6.4 oz (65.953 kg). Her oral temperature is 97.6 F (36.4 C). Her blood pressure is 134/74 and her pulse is 100. Her respiration is 20. .   All of thrush present within the oral cavity. Lymphadenopathy present, level II, bilaterally left greater than the right.   Lab Findings: Lab Results  Component Value Date   WBC 14.4* 07/05/2014   HGB 14.4 07/05/2014   HCT 42.9 07/05/2014   MCV 92.3 07/05/2014   PLT 299 07/05/2014     Radiographic Findings: No results found.  Impression/Plan:    The patient is having some decreased appetite associated with nausea. I have called in a prescription for Reglan. Nystatin also has been called in for the patient's mild degree of thrush seen on exam today. I will obtain a CT scan of the neck to evaluate the extent of the patient's lymphadenopathy. She may be a candidate for  additional palliative radiation treatment to the neck, especially on the left. She is asymptomatic for this at this time. We will bring the patient in for follow-up after this CT scan has been completed.    Jodelle Gross, M.D., Ph.D.

## 2014-10-09 NOTE — Telephone Encounter (Signed)
Called patient, she is running late will be here in 15 min 10:17 AM

## 2014-10-09 NOTE — Progress Notes (Signed)
Follow up  Metastatic lung cancer rad txs 07/16/14-07/31/14  Brain, chest,right humerus, , c/o nausea still, looks like thrush again , no vision changes, shoulder pain prn, says after eating or drinking fluids 1 hour has belching and heartburn, left side of face swollen since past 2.5 weeks, eats a few bites and then gets nauseated, no difficulty swallowing or chewing, patient stopped taking her Keppra on her own, appetite poor past few days  And no energy 10:50 AM

## 2014-10-10 ENCOUNTER — Telehealth: Payer: Self-pay | Admitting: *Deleted

## 2014-10-10 NOTE — Telephone Encounter (Signed)
CALLED PATIENT TO INFORM OF LAB, TEST AND FU VIST, SPOKE WITH PATIENT'S SON (JASON MCMASTERS)  AND HE IS AWARE OF THESE APPTS.

## 2014-10-11 ENCOUNTER — Encounter (HOSPITAL_COMMUNITY): Payer: Self-pay

## 2014-10-11 ENCOUNTER — Ambulatory Visit
Admission: RE | Admit: 2014-10-11 | Discharge: 2014-10-11 | Disposition: A | Discharge status: Home / Self Care | Payer: Medicare HMO | Source: Ambulatory Visit | Attending: Radiation Oncology | Admitting: Radiation Oncology

## 2014-10-11 ENCOUNTER — Ambulatory Visit (HOSPITAL_COMMUNITY)
Admission: RE | Admit: 2014-10-11 | Discharge: 2014-10-11 | Disposition: A | Discharge status: Home / Self Care | Payer: Medicare HMO | Source: Ambulatory Visit | Attending: Radiation Oncology | Admitting: Radiation Oncology

## 2014-10-11 DIAGNOSIS — C7931 Secondary malignant neoplasm of brain: Secondary | ICD-10-CM

## 2014-10-11 DIAGNOSIS — R590 Localized enlarged lymph nodes: Secondary | ICD-10-CM | POA: Insufficient documentation

## 2014-10-11 DIAGNOSIS — R221 Localized swelling, mass and lump, neck: Secondary | ICD-10-CM | POA: Diagnosis not present

## 2014-10-11 DIAGNOSIS — C770 Secondary and unspecified malignant neoplasm of lymph nodes of head, face and neck: Secondary | ICD-10-CM | POA: Diagnosis not present

## 2014-10-11 DIAGNOSIS — Z923 Personal history of irradiation: Secondary | ICD-10-CM | POA: Insufficient documentation

## 2014-10-11 DIAGNOSIS — E041 Nontoxic single thyroid nodule: Secondary | ICD-10-CM | POA: Diagnosis not present

## 2014-10-11 DIAGNOSIS — J90 Pleural effusion, not elsewhere classified: Secondary | ICD-10-CM | POA: Diagnosis not present

## 2014-10-11 DIAGNOSIS — C3490 Malignant neoplasm of unspecified part of unspecified bronchus or lung: Secondary | ICD-10-CM | POA: Diagnosis not present

## 2014-10-11 DIAGNOSIS — I7090 Unspecified atherosclerosis: Secondary | ICD-10-CM | POA: Diagnosis not present

## 2014-10-11 LAB — BUN AND CREATININE (CC13)
BUN: 8.2 mg/dL (ref 7.0–26.0)
CREATININE: 0.8 mg/dL (ref 0.6–1.1)
EGFR: >90 mL/min/{1.73_m2} (ref >90)

## 2014-10-11 MED ORDER — IOHEXOL 300 MG/ML  SOLN
100.0000 mL | Freq: Once | INTRAMUSCULAR | Status: AC | PRN
  Administered 2014-10-11: 100 mL via INTRAVENOUS

## 2014-10-18 ENCOUNTER — Telehealth: Payer: Self-pay | Admitting: *Deleted

## 2014-10-18 NOTE — Telephone Encounter (Signed)
Returned call to patient who wanted results of her CT neck done 10/11/14, apologized to the patient stating that Dr.Moody was in our satellite office all day today ,but will E-mail MD today and he should call you on Monday with those results and I would put the results printed on his desk,,patient thanked this RN for the return call 10:45 AM

## 2014-10-23 ENCOUNTER — Telehealth: Payer: Self-pay | Admitting: *Deleted

## 2014-10-23 NOTE — Telephone Encounter (Signed)
CALLED PATIENT TO ALTER FU APPT. DUE TO DR. Lisbeth Renshaw RUNNING LATE FROM  [place], APPT. RESCHEDULED FOR 10-25-14 @ 3:30 PM, PATIENT AGREED TO APPT. DATE AND TIME

## 2014-10-24 ENCOUNTER — Ambulatory Visit: Admission: RE | Admit: 2014-10-24 | Payer: Medicare HMO | Source: Ambulatory Visit | Admitting: Radiation Oncology

## 2014-10-25 ENCOUNTER — Ambulatory Visit: Admission: RE | Admit: 2014-10-25 | Payer: Medicare HMO | Source: Ambulatory Visit

## 2014-10-25 ENCOUNTER — Encounter: Payer: Self-pay | Admitting: Radiation Oncology

## 2014-10-25 ENCOUNTER — Ambulatory Visit (HOSPITAL_COMMUNITY)
Admit: 2014-10-25 | Discharge: 2014-10-25 | Disposition: A | Discharge status: Home / Self Care | Payer: Medicare PPO | Source: Ambulatory Visit | Attending: Radiation Oncology | Admitting: Radiation Oncology

## 2014-10-25 DIAGNOSIS — C7952 Secondary malignant neoplasm of bone marrow: Secondary | ICD-10-CM

## 2014-10-25 DIAGNOSIS — B379 Candidiasis, unspecified: Secondary | ICD-10-CM | POA: Diagnosis not present

## 2014-10-25 DIAGNOSIS — C7931 Secondary malignant neoplasm of brain: Secondary | ICD-10-CM

## 2014-10-25 DIAGNOSIS — C770 Secondary and unspecified malignant neoplasm of lymph nodes of head, face and neck: Secondary | ICD-10-CM | POA: Diagnosis not present

## 2014-10-25 DIAGNOSIS — C7951 Secondary malignant neoplasm of bone: Secondary | ICD-10-CM

## 2014-10-25 DIAGNOSIS — C3490 Malignant neoplasm of unspecified part of unspecified bronchus or lung: Secondary | ICD-10-CM | POA: Insufficient documentation

## 2014-10-25 LAB — CBC WITH DIFFERENTIAL/PLATELET
Basophils Absolute: 0 10*3/uL (ref 0.0–0.1)
Basophils Relative: 0 % (ref 0–1)
Eosinophils Absolute: 0.1 10*3/uL (ref 0.0–0.7)
Eosinophils Relative: 1 % (ref 0–5)
HCT: 33.4 % — ABNORMAL LOW (ref 36.0–46.0)
HEMOGLOBIN: 11.4 g/dL — AB (ref 12.0–15.0)
LYMPHS ABS: 0.6 10*3/uL — AB (ref 0.7–4.0)
LYMPHS PCT: 7 % — AB (ref 12–46)
MCH: 29.4 pg (ref 26.0–34.0)
MCHC: 34.1 g/dL (ref 30.0–36.0)
MCV: 86.1 fL (ref 78.0–100.0)
Monocytes Absolute: 1.1 10*3/uL — ABNORMAL HIGH (ref 0.1–1.0)
Monocytes Relative: 11 % (ref 3–12)
NEUTROS PCT: 81 % — AB (ref 43–77)
Neutro Abs: 7.5 10*3/uL (ref 1.7–7.7)
Platelets: 371 10*3/uL (ref 150–400)
RBC: 3.88 MIL/uL (ref 3.87–5.11)
RDW: 17.2 % — ABNORMAL HIGH (ref 11.5–15.5)
WBC: 9.3 10*3/uL (ref 4.0–10.5)

## 2014-10-25 LAB — COMPREHENSIVE METABOLIC PANEL
ALK PHOS: 534 U/L — AB (ref 39–117)
ALT: 181 U/L — AB (ref 0–35)
AST: 140 U/L — ABNORMAL HIGH (ref 0–37)
Albumin: 2.6 g/dL — ABNORMAL LOW (ref 3.5–5.2)
Anion gap: 15 (ref 5–15)
BILIRUBIN TOTAL: 15.2 mg/dL — AB (ref 0.3–1.2)
BUN: 12 mg/dL (ref 6–23)
CHLORIDE: 92 meq/L — AB (ref 96–112)
CO2: 24 meq/L (ref 19–32)
Calcium: 9.6 mg/dL (ref 8.4–10.5)
Creatinine, Ser: 0.65 mg/dL (ref 0.50–1.10)
GFR calc Af Amer: >90 mL/min (ref >90)
GFR calc non Af Amer: >90 mL/min (ref >90)
GLUCOSE: 133 mg/dL — AB (ref 70–99)
POTASSIUM: 3.6 meq/L — AB (ref 3.7–5.3)
Sodium: 131 mEq/L — ABNORMAL LOW (ref 137–147)
Total Protein: 6.4 g/dL (ref 6.0–8.3)

## 2014-10-25 MED ORDER — PROCHLORPERAZINE MALEATE 10 MG PO TABS: 10.0000 mg | ORAL_TABLET | Freq: Four times a day (QID) | ORAL | Status: AC | PRN

## 2014-10-25 NOTE — Progress Notes (Signed)
Radiation Oncology         [phone]) [phone] ________________________________  Name: Kristina Lawson MRN: [id number]  Date: 10/25/2014  DOB: Apr 22, 1946  Follow-Up Visit Note  CC: [name], MD  Wyatt Portela, MD  Diagnosis:   Metastatic non-small cell lung cancer  Interval Since Last Radiation:   3 months   Narrative:  The patient returns today to discuss her recent CT scan of the neck. This did demonstrate lymphadenopathy consistent with progressive tumor bilaterally within the neck, greater on the left. She notes no new symptoms regarding this. She complains of an ongoing poor appetite and tiredness.                         ALLERGIES:  is allergic to penicillins.  Meds: Current Outpatient Prescriptions  Medication Sig Dispense Refill  . bisoprolol (ZEBETA) 5 MG tablet Take 1 tablet (5 mg total) by mouth daily. 30 tablet 5  . HYDROcodone-acetaminophen (NORCO/VICODIN) 5-325 MG per tablet 1 tablet every 6 (six) hours as needed.     . metoCLOPramide (REGLAN) 10 MG tablet Take 1 tablet (10 mg total) by mouth 4 (four) times daily. 60 tablet 1  . nystatin (MYCOSTATIN) 100000 UNIT/ML suspension Take 5 mLs (500,000 Units total) by mouth 4 (four) times daily. 240 mL 0  . Omega-3 Fatty Acids (FISH OIL) 1200 MG CAPS Take 2,400 mg by mouth daily.     . Cholecalciferol (VITAMIN D) 2000 UNITS tablet Take 2,000 Units by mouth daily.    . hyaluronate sodium (RADIAPLEXRX) GEL Apply 1 application topically 2 (two) times daily. Apply to chest,shoulder,& brain areas where radiation is treated daily  And bedtime    . levETIRAcetam (KEPPRA) 500 MG tablet Take 1 tablet (500 mg total) by mouth 2 (two) times daily. (Patient not taking: Reported on 10/09/2014) 60 tablet 0  . levETIRAcetam (KEPPRA) 500 MG tablet Take 500 mg by mouth.    Marland Kitchen LORazepam (ATIVAN) 0.5 MG tablet Take 1 tablet (0.5 mg total) by mouth once as needed for anxiety. Take 30 min prior to treatment for anxiety. (Patient not taking:  Reported on 10/09/2014) 5 tablet 0   No current facility-administered medications for this encounter.    Physical Findings: The patient is in no acute distress. Patient is alert and oriented.  vitals were not taken for this visit..   Small degree of thrush present within the oral cavity. Scleral icterus present on exam.  Lab Findings: Lab Results  Component Value Date   WBC 14.4* 07/05/2014   HGB 14.4 07/05/2014   HCT 42.9 07/05/2014   MCV 92.3 07/05/2014   PLT 299 07/05/2014     Radiographic Findings: Ct Soft Tissue Neck W Contrast  10/11/2014   CLINICAL DATA:  Lung cancer.  LEFT-sided neck swelling for 2 weeks.  EXAM: CT NECK WITH CONTRAST  TECHNIQUE: Multidetector CT imaging of the neck was performed using the standard protocol following the bolus administration of intravenous contrast.  CONTRAST:  1110mL OMNIPAQUE IOHEXOL 300 MG/ML  SOLN  COMPARISON:  MR brain 06/15/2014.  PET scan 07/03/2014.  FINDINGS: BILATERAL centrally necrotic level 2, level 3, ground level 4 lymph nodes are seen in the neck with mass effect on the LEFT internal jugular vein without visible thrombus. As seen on image 34 series 2, a retromandibular node measures 38 x 23 mm cross-section. As seen on image 78 series 2, conglomerate RIGHT level 4 nodal mass measures 31 x 29 mm.  10 x 14  mm RIGHT thyroid nodule, nonspecific. This was mildly hypermetabolic on prior PET scan but appears roughly stable. No airway compromise. RIGHT pleural effusion. Mediastinal adenopathy. Vascular calcification.  Intracranial metastatic disease difficult to visualize on this exam although osseous spread to the LEFT temporalis region appears improved.  IMPRESSION: Worsening metastatic disease to the cervical lymph nodes as described. These have significantly progressed from prior PET scan.   Electronically Signed   By: Rolla Flatten M.D.   On: 10/11/2014 15:13    Impression:    I discussed the CT scan with the patient and the possibility of  proceeding with radiation treatment bilaterally to the neck. I discussed the pros and cons of this approach as well as the possible benefit and side effects/risks. All of her questions were answered. After this discussion, the patient indicated that she didn't wish to proceed with simulation.   Plan:   -Bilateral lymphadenopathy within the neck:  The patient will undergo a simulation next week such that I can proceed with treatment planning for palliative radiation treatment. I anticipate treating the patient to a dose of 30 gray in 10 fractions. -Thrush:  The patient will continue nystatin. -A CBC with differential and a CMP were ordered today. The patient appears jaundiced. -Poor appetite:  I have given the patient a prescriptions for Compazine which she can take in addition to Reglan which was given at our last visit. The patient may benefit from Megace at some point.   Jodelle Gross, M.D., Ph.D.

## 2014-10-25 NOTE — Progress Notes (Addendum)
Follow up s/p rad txs 07/16/14-07/31/14, brain, chest/rt humerus, poor appetite, still has thrush,takes nystatin  As directed, and reglan,poor appetite, nausea still, left side face still swollen, eyes are jaundiced, ,fatigued, 91% room air, fatigued after being up after a few hours in the day, physical therapy completed,  last week,  CT neck results in from 10/11/14, 3:32 PM

## 2014-10-29 ENCOUNTER — Ambulatory Visit
Admission: RE | Admit: 2014-10-29 | Discharge: 2014-10-29 | Disposition: A | Discharge status: Home / Self Care | Payer: Medicare PPO | Source: Ambulatory Visit | Attending: Radiation Oncology | Admitting: Radiation Oncology

## 2014-10-29 ENCOUNTER — Other Ambulatory Visit: Payer: Self-pay | Admitting: Oncology

## 2014-10-29 ENCOUNTER — Telehealth: Payer: Self-pay | Admitting: Oncology

## 2014-10-29 ENCOUNTER — Telehealth: Payer: Self-pay | Admitting: *Deleted

## 2014-10-29 ENCOUNTER — Inpatient Hospital Stay (HOSPITAL_COMMUNITY)
Admission: AD | Admit: 2014-10-29 | Discharge: 2014-11-01 | DRG: 435 | Disposition: A | Discharge status: Home / Self Care | Payer: Medicare PPO | Source: Ambulatory Visit | Attending: Internal Medicine | Admitting: Internal Medicine

## 2014-10-29 ENCOUNTER — Encounter (HOSPITAL_COMMUNITY): Payer: Self-pay | Admitting: *Deleted

## 2014-10-29 DIAGNOSIS — Z87891 Personal history of nicotine dependence: Secondary | ICD-10-CM

## 2014-10-29 DIAGNOSIS — Z6824 Body mass index (BMI) 24.0-24.9, adult: Secondary | ICD-10-CM

## 2014-10-29 DIAGNOSIS — C7802 Secondary malignant neoplasm of left lung: Secondary | ICD-10-CM | POA: Diagnosis present

## 2014-10-29 DIAGNOSIS — C7951 Secondary malignant neoplasm of bone: Secondary | ICD-10-CM | POA: Diagnosis present

## 2014-10-29 DIAGNOSIS — Z923 Personal history of irradiation: Secondary | ICD-10-CM

## 2014-10-29 DIAGNOSIS — K831 Obstruction of bile duct: Secondary | ICD-10-CM | POA: Diagnosis present

## 2014-10-29 DIAGNOSIS — C7931 Secondary malignant neoplasm of brain: Secondary | ICD-10-CM | POA: Diagnosis present

## 2014-10-29 DIAGNOSIS — C7889 Secondary malignant neoplasm of other digestive organs: Secondary | ICD-10-CM | POA: Diagnosis present

## 2014-10-29 DIAGNOSIS — E43 Unspecified severe protein-calorie malnutrition: Secondary | ICD-10-CM | POA: Diagnosis present

## 2014-10-29 DIAGNOSIS — Z510 Encounter for antineoplastic radiation therapy: Secondary | ICD-10-CM | POA: Diagnosis present

## 2014-10-29 DIAGNOSIS — C770 Secondary and unspecified malignant neoplasm of lymph nodes of head, face and neck: Secondary | ICD-10-CM | POA: Insufficient documentation

## 2014-10-29 DIAGNOSIS — Z79899 Other long term (current) drug therapy: Secondary | ICD-10-CM | POA: Diagnosis not present

## 2014-10-29 DIAGNOSIS — C7989 Secondary malignant neoplasm of other specified sites: Secondary | ICD-10-CM | POA: Diagnosis present

## 2014-10-29 DIAGNOSIS — E871 Hypo-osmolality and hyponatremia: Secondary | ICD-10-CM | POA: Diagnosis present

## 2014-10-29 DIAGNOSIS — D638 Anemia in other chronic diseases classified elsewhere: Secondary | ICD-10-CM | POA: Diagnosis present

## 2014-10-29 DIAGNOSIS — C772 Secondary and unspecified malignant neoplasm of intra-abdominal lymph nodes: Secondary | ICD-10-CM | POA: Diagnosis present

## 2014-10-29 DIAGNOSIS — C786 Secondary malignant neoplasm of retroperitoneum and peritoneum: Secondary | ICD-10-CM | POA: Diagnosis present

## 2014-10-29 DIAGNOSIS — C3492 Malignant neoplasm of unspecified part of left bronchus or lung: Secondary | ICD-10-CM

## 2014-10-29 DIAGNOSIS — Z9889 Other specified postprocedural states: Secondary | ICD-10-CM

## 2014-10-29 DIAGNOSIS — I10 Essential (primary) hypertension: Secondary | ICD-10-CM | POA: Diagnosis present

## 2014-10-29 DIAGNOSIS — R17 Unspecified jaundice: Secondary | ICD-10-CM | POA: Insufficient documentation

## 2014-10-29 DIAGNOSIS — C3490 Malignant neoplasm of unspecified part of unspecified bronchus or lung: Secondary | ICD-10-CM | POA: Diagnosis present

## 2014-10-29 DIAGNOSIS — R918 Other nonspecific abnormal finding of lung field: Secondary | ICD-10-CM

## 2014-10-29 DIAGNOSIS — T85590A Other mechanical complication of bile duct prosthesis, initial encounter: Secondary | ICD-10-CM

## 2014-10-29 DIAGNOSIS — C787 Secondary malignant neoplasm of liver and intrahepatic bile duct: Principal | ICD-10-CM | POA: Diagnosis present

## 2014-10-29 DIAGNOSIS — J90 Pleural effusion, not elsewhere classified: Secondary | ICD-10-CM | POA: Diagnosis present

## 2014-10-29 DIAGNOSIS — K315 Obstruction of duodenum: Secondary | ICD-10-CM | POA: Diagnosis present

## 2014-10-29 DIAGNOSIS — E785 Hyperlipidemia, unspecified: Secondary | ICD-10-CM | POA: Diagnosis present

## 2014-10-29 LAB — COMPREHENSIVE METABOLIC PANEL
ALK PHOS: 488 U/L — AB (ref 39–117)
ALT: 154 U/L — AB (ref 0–35)
ANION GAP: 9 (ref 5–15)
AST: 136 U/L — ABNORMAL HIGH (ref 0–37)
Albumin: 2.4 g/dL — ABNORMAL LOW (ref 3.5–5.2)
BUN: 12 mg/dL (ref 6–23)
CO2: 22 mmol/L (ref 19–32)
Calcium: 8.8 mg/dL (ref 8.4–10.5)
Chloride: 99 mEq/L (ref 96–112)
Creatinine, Ser: 0.38 mg/dL — ABNORMAL LOW (ref 0.50–1.10)
GFR calc Af Amer: >90 mL/min (ref >90)
GFR calc non Af Amer: >90 mL/min (ref >90)
Glucose, Bld: 99 mg/dL (ref 70–99)
POTASSIUM: 3.6 mmol/L (ref 3.5–5.1)
SODIUM: 130 mmol/L — AB (ref 135–145)
TOTAL PROTEIN: 5.9 g/dL — AB (ref 6.0–8.3)
Total Bilirubin: 15.6 mg/dL — ABNORMAL HIGH (ref 0.3–1.2)

## 2014-10-29 LAB — CBC
HCT: 30.5 % — ABNORMAL LOW (ref 36.0–46.0)
Hemoglobin: 10.4 g/dL — ABNORMAL LOW (ref 12.0–15.0)
MCH: 29.2 pg (ref 26.0–34.0)
MCHC: 34.1 g/dL (ref 30.0–36.0)
MCV: 85.7 fL (ref 78.0–100.0)
Platelets: 274 10*3/uL (ref 150–400)
RBC: 3.56 MIL/uL — ABNORMAL LOW (ref 3.87–5.11)
RDW: 18.2 % — AB (ref 11.5–15.5)
WBC: 8.6 10*3/uL (ref 4.0–10.5)

## 2014-10-29 LAB — TSH: TSH: 1.519 u[IU]/mL (ref 0.350–4.500)

## 2014-10-29 LAB — PHOSPHORUS: Phosphorus: 2.8 mg/dL (ref 2.3–4.6)

## 2014-10-29 LAB — PROTIME-INR
INR: 1.18 (ref 0.00–1.49)
PROTHROMBIN TIME: 15.2 s (ref 11.6–15.2)

## 2014-10-29 LAB — MAGNESIUM: Magnesium: 1.8 mg/dL (ref 1.5–2.5)

## 2014-10-29 MED ORDER — FLUCONAZOLE 100MG IVPB
100.0000 mg | INTRAVENOUS | Status: DC
  Administered 2014-10-29 – 2014-10-31 (×3): 100 mg via INTRAVENOUS
  Filled 2014-10-29 (×4): qty 50

## 2014-10-29 MED ORDER — GI COCKTAIL ~~LOC~~
30.0000 mL | Freq: Once | ORAL | Status: AC
  Administered 2014-10-29: 30 mL via ORAL
  Filled 2014-10-29: qty 30

## 2014-10-29 MED ORDER — HYDROMORPHONE HCL 1 MG/ML IJ SOLN
0.5000 mg | INTRAMUSCULAR | Status: DC | PRN
  Administered 2014-10-29 – 2014-10-31 (×3): 0.5 mg via INTRAVENOUS
  Filled 2014-10-29 (×3): qty 1

## 2014-10-29 MED ORDER — SODIUM CHLORIDE 0.9 % IJ SOLN: 3.0000 mL | Freq: Two times a day (BID) | INTRAMUSCULAR | Status: DC

## 2014-10-29 MED ORDER — DOCUSATE SODIUM 100 MG PO CAPS
100.0000 mg | ORAL_CAPSULE | Freq: Two times a day (BID) | ORAL | Status: DC
  Administered 2014-10-30 – 2014-11-01 (×3): 100 mg via ORAL
  Filled 2014-10-29 (×7): qty 1

## 2014-10-29 MED ORDER — SODIUM CHLORIDE 0.9 % IV SOLN
INTRAVENOUS | Status: DC
  Administered 2014-10-30 – 2014-10-31 (×3): via INTRAVENOUS

## 2014-10-29 MED ORDER — SENNA 8.6 MG PO TABS
1.0000 | ORAL_TABLET | Freq: Two times a day (BID) | ORAL | Status: DC
  Administered 2014-10-30 – 2014-11-01 (×3): 8.6 mg via ORAL
  Filled 2014-10-29 (×4): qty 1

## 2014-10-29 MED ORDER — ENOXAPARIN SODIUM 40 MG/0.4ML ~~LOC~~ SOLN
40.0000 mg | SUBCUTANEOUS | Status: DC
  Filled 2014-10-29 (×2): qty 0.4

## 2014-10-29 MED ORDER — LEVALBUTEROL HCL 0.63 MG/3ML IN NEBU: 0.6300 mg | INHALATION_SOLUTION | Freq: Four times a day (QID) | RESPIRATORY_TRACT | Status: DC | PRN

## 2014-10-29 MED ORDER — OXYCODONE HCL 5 MG PO TABS
5.0000 mg | ORAL_TABLET | ORAL | Status: DC | PRN
  Filled 2014-10-29: qty 1

## 2014-10-29 MED ORDER — ONDANSETRON HCL 4 MG/2ML IJ SOLN
4.0000 mg | Freq: Four times a day (QID) | INTRAMUSCULAR | Status: DC | PRN
  Filled 2014-10-29: qty 2

## 2014-10-29 MED ORDER — ONDANSETRON HCL 4 MG PO TABS: 4.0000 mg | ORAL_TABLET | Freq: Four times a day (QID) | ORAL | Status: DC | PRN

## 2014-10-29 NOTE — Telephone Encounter (Signed)
Called bed control spoke with Robin, after patient called back and agreed to come in to be admitted for biliary obstruction/abnormal labs, Dr. Farrel Gobble to admit patient, referral for GI Dr. Vena Rua group to see the patient once admitted , gave information to Shirlean Mylar, she will call the patient to come to [facility] bed [address], but she will advise could be given another room as there are other rooms available also,. Astrid Divine she stated she would call the patient now 2:11 PM

## 2014-10-29 NOTE — H&P (Addendum)
[facility] History and Physical  Kynedi Profitt UVO:[id number] DOB: 15-Feb-1946 DOA: 10/29/2014  Referring physician: Dr. Lisbeth Renshaw  PCP: [name], MD   Chief Complaint: Abnormal labs   HPI:  68 year old female with stage IV lung cancer, brain metastasis, diagnosed in August and recently started radiation therapy for cervical adenopathy, referred by Dr. Lisbeth Renshaw for admission because of abnormal labs and elevated bilirubin. Patient had a bilirubin of 15.2 and elevated AST ALT alkaline phosphatase. Patient was referred to gastroenterology who recommended inpatient evaluation. Labs revealed elevated total bili at 15.2 with normal bili of 0.6 just 4 months ago. AST 140, ALT 181, and ALP 534, which were all previously normal as well. Patient does complain of nausea which is intermittent especially with food, but denies any abdominal pain.      Review of Systems: negative for the following  Constitutional: Denies fever, chills, diaphoresis, appetite change and fatigue.  HEENT: Denies photophobia, eye pain, redness, hearing loss, ear pain, congestion, sore throat, rhinorrhea, sneezing, mouth sores, trouble swallowing, neck pain, neck stiffness and tinnitus.  Respiratory: Denies SOB, DOE, cough, chest tightness, and wheezing.  Cardiovascular: Denies chest pain, palpitations and leg swelling.  Gastrointestinal: Occasional nausea, vomiting, abdominal pain, diarrhea, constipation, blood in stool and abdominal distention.  Genitourinary: Denies dysuria, urgency, frequency, hematuria, flank pain and difficulty urinating.  Musculoskeletal: Denies myalgias, back pain, joint swelling, arthralgias and gait problem.  Skin: Denies pallor, rash and wound.  Neurological: Denies dizziness, seizures, syncope, weakness, light-headedness, numbness and headaches.  Hematological: Denies adenopathy. Easy bruising, personal or family bleeding history  Psychiatric/Behavioral: Denies suicidal ideation,  mood changes, confusion, nervousness, sleep disturbance and agitation       Past Medical History  Diagnosis Date  . Hypertension   . Smoker   . Hyperlipidemia   . Brain cancer 06/17/14    multiple brain metastses  . 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 Seizures 06/17/14    witness at home brought to the ED  . S/P radiation therapy 07/16/14-07/31/14/palliative    brain,left base&infratemporal .chest/meiastinum,proxiaml rt humerus     Past Surgical History  Procedure Laterality Date  . Ct biopsy  06/19/14    Left adrenal mass      Social History:  reports that she quit smoking about 4 months ago. Her smoking use included Cigarettes. She has a 45 pack-year smoking history. She has never used smokeless tobacco. She reports that she drinks alcohol. She reports that she does not use illicit drugs.    Allergies  Allergen Reactions  . Penicillins Other (See Comments)    unknown    Family History  Problem Relation Age of Onset  . Heart disease Mother      Prior to Admission medications   Medication Sig Start Date End Date Taking? Authorizing Provider  bisoprolol (ZEBETA) 5 MG tablet Take 1 tablet (5 mg total) by mouth daily. 07/23/14  Yes Susy Frizzle, MD  Cholecalciferol (VITAMIN D) 2000 UNITS tablet Take 2,000 Units by mouth daily.   Yes Historical Provider, MD  hyaluronate sodium (RADIAPLEXRX) GEL Apply 1 application topically 2 (two) times daily. Apply to chest,shoulder,& brain areas where radiation is treated daily  And bedtime 07/17/14  Yes Jodelle Gross, MD  HYDROcodone-acetaminophen (NORCO/VICODIN) 5-325 MG per tablet 1 tablet every 6 (six) hours as needed for moderate pain or severe pain.  07/11/14  Yes Historical Provider, MD  metoCLOPramide (REGLAN) 10 MG tablet Take 1 tablet (10 mg  total) by mouth 4 (four) times daily. Patient taking differently: Take 10 mg by mouth daily.  10/09/14  Yes Jodelle Gross, MD  nystatin (MYCOSTATIN)  100000 UNIT/ML suspension Take 5 mLs (500,000 Units total) by mouth 4 (four) times daily. Patient taking differently: Take 5 mLs by mouth daily.  10/09/14  Yes Jodelle Gross, MD  Omega-3 Fatty Acids (FISH OIL) 1200 MG CAPS Take 2,400 mg by mouth daily.    Yes Historical Provider, MD  levETIRAcetam (KEPPRA) 500 MG tablet Take 1 tablet (500 mg total) by mouth 2 (two) times daily. Patient not taking: Reported on 10/09/2014 08/19/14   Carlton Adam, PA-C  levETIRAcetam (KEPPRA) 500 MG tablet Take 500 mg by mouth.    Historical Provider, MD  LORazepam (ATIVAN) 0.5 MG tablet Take 1 tablet (0.5 mg total) by mouth once as needed for anxiety. Take 30 min prior to treatment for anxiety. Patient not taking: Reported on 10/09/2014 06/28/14   Jodelle Gross, MD  prochlorperazine (COMPAZINE) 10 MG tablet Take 1 tablet (10 mg total) by mouth every 6 (six) hours as needed for nausea or vomiting. 10/25/14   Jodelle Gross, MD     Physical Exam: Filed Vitals:   10/29/14 1630  BP: 145/69  Pulse: 88  Temp: 97.4 F (36.3 C)  TempSrc: Tympanic  Resp: 16  Height: 5\' 3"  (1.6 m)  Weight: 63.141 kg (139 lb 3.2 oz)  SpO2: 98%     Constitutional: Vital signs reviewed. Patient is a well-developed and well-nourished in no acute distress and cooperative with exam. Alert and oriented x3.  Head: Normocephalic and atraumatic  Ear: TM normal bilaterally  Mouth: no erythema or exudates, MMM  Eyes: PERRL, EOMI, conjunctivae jaundiced, No scleral icterus.  Neck: Supple, Trachea midline normal ROM, No JVD, mass, thyromegaly, or carotid bruit present.  Cardiovascular: RRR, S1 normal, S2 normal, no MRG, pulses symmetric and intact bilaterally  Pulmonary/Chest: CTAB, no wheezes, rales, or rhonchi  Abdominal: Soft. Non-tender, non-distended, bowel sounds are normal, no masses, organomegaly, or guarding present.  GU: no CVA tenderness Musculoskeletal: No joint deformities, erythema, or stiffness, ROM full and no nontender Ext:  no edema and no cyanosis, pulses palpable bilaterally (DP and PT)  Hematology: no cervical, inginal, or axillary adenopathy.  Neurological: A&O x3, Strenght is normal and symmetric bilaterally, cranial nerve II-XII are grossly intact, no focal motor deficit, sensory intact to light touch bilaterally.  Skin: Warm, dry and intact. No rash, cyanosis, or clubbing.  Psychiatric: Normal mood and affect. speech and behavior is normal. Judgment and thought content normal. Cognition and memory are normal.       Labs on Admission:    Basic Metabolic Panel:  Recent Labs Lab 10/25/14 1515  NA 131*  K 3.6*  CL 92*  CO2 24  GLUCOSE 133*  BUN 12  CREATININE 0.65  CALCIUM 9.6   Liver Function Tests:  Recent Labs Lab 10/25/14 1515  AST 140*  ALT 181*  ALKPHOS 534*  BILITOT 15.2*  PROT 6.4  ALBUMIN 2.6*   No results for input(s): LIPASE, AMYLASE in the last 168 hours. No results for input(s): AMMONIA in the last 168 hours. CBC:  Recent Labs Lab 10/25/14 1515 10/29/14 1753  WBC 9.3 8.6  NEUTROABS 7.5  --   HGB 11.4* 10.4*  HCT 33.4* 30.5*  MCV 86.1 85.7  PLT 371 274   Cardiac Enzymes: No results for input(s): CKTOTAL, CKMB, CKMBINDEX, TROPONINI in the last 168 hours.  BNP (last  3 results) No results for input(s): PROBNP in the last 8760 hours.    CBG: No results for input(s): GLUCAP in the last 168 hours.  Radiological Exams on Admission: No results found.  EKG: Independently reviewed.   Assessment/Plan Active Problems:   Obstructive jaundice   Obstructive jaundice Probably secondary to metastatic lung cancer CMP pending GI has seen the patient and recommend CT abdomen and pelvis Possible ERCP and palliative stent tomorrow  Hypertension Continue to monitor and use as needed hydralazine  Stage IV small cell lung cancer Will notify Dr. Alen Blew    Code Status:   full Family Communication: Patient's son present by the bedside Disposition Plan: admit    Time spent: 70 mins   [facility] Pager [phone]  If 7PM-7AM, please contact night-coverage [email or web address] Password [facility] 10/29/2014, 6:23 PM

## 2014-10-29 NOTE — Progress Notes (Signed)
Dear Dr. Allyson Sabal: This patient has been identified as a candidate for PICC for the following reason (s): poor veins/poor circulatory system (CHF, COPD, emphysema, diabetes, steroid use, IV drug abuse, etc.) If you agree, please write an order for the indicated device. For any questions contact the Vascular Access Team at [phone] if no answer, please leave a message.  Thank you for supporting the early vascular access assessment program.

## 2014-10-29 NOTE — Telephone Encounter (Signed)
Called patient asked if she knew to come to the hospital at [facility], they have a room on 3rd floor [place], "yes,they called, I'm on my way now" 3:03 PM

## 2014-10-29 NOTE — Consult Note (Signed)
Referring Provider: No ref. provider found Primary Care Physician:  [name], MD Primary Gastroenterologist:  None, unassigned  Reason for Consultation:  Obstructive jaundice  HPI: Kristina Lawson is a 68 y.o. female with stage IV lung cancer and multiple brain metastasis.  Diagnosed in August 2015 when she presented with seizure and left-sided weakness.  She has received radiation to the brain and chest/mediastinum as well as the proximal right humerus.  Planning to have radiation to bilateral cervical lymphadenopathy that has progressed recently.  Now presenting with jaundice.  She saw radiation oncology, Dr. Lisbeth Renshaw, for CT simulation for the cervical adenopathy on 12/18 and appeared jaundice.  Labs revealed elevated total bili at 15.2 with normal bili of 0.6 just 4 months ago.  AST 140, ALT 181, and ALP 534, which were all previously normal as well.  No new labs or imaging yet today.  Denies abdominal pain, fevers, chills, or vomiting.  Admits to a lot of nausea when she tries to eat.   Past Medical History  Diagnosis Date  . Hypertension   . Smoker   . Hyperlipidemia   . Brain cancer 06/17/14    multiple brain metastses  . 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 Seizures 06/17/14    witness at home brought to the ED  . S/P radiation therapy 07/16/14-07/31/14/palliative    brain,left base&infratemporal .chest/meiastinum,proxiaml rt humerus    Past Surgical History  Procedure Laterality Date  . Ct biopsy  06/19/14    Left adrenal mass    Prior to Admission medications   Medication Sig Start Date End Date Taking? Authorizing Provider  bisoprolol (ZEBETA) 5 MG tablet Take 1 tablet (5 mg total) by mouth daily. 07/23/14   Susy Frizzle, MD  Cholecalciferol (VITAMIN D) 2000 UNITS tablet Take 2,000 Units by mouth daily.    Historical Provider, MD  hyaluronate sodium (RADIAPLEXRX) GEL Apply 1 application topically 2 (two) times daily.  Apply to chest,shoulder,& brain areas where radiation is treated daily  And bedtime 07/17/14   Jodelle Gross, MD  HYDROcodone-acetaminophen (NORCO/VICODIN) 5-325 MG per tablet 1 tablet every 6 (six) hours as needed.  07/11/14   Historical Provider, MD  levETIRAcetam (KEPPRA) 500 MG tablet Take 1 tablet (500 mg total) by mouth 2 (two) times daily. Patient not taking: Reported on 10/09/2014 08/19/14   Carlton Adam, PA-C  levETIRAcetam (KEPPRA) 500 MG tablet Take 500 mg by mouth.    Historical Provider, MD  LORazepam (ATIVAN) 0.5 MG tablet Take 1 tablet (0.5 mg total) by mouth once as needed for anxiety. Take 30 min prior to treatment for anxiety. Patient not taking: Reported on 10/09/2014 06/28/14   Jodelle Gross, MD  metoCLOPramide (REGLAN) 10 MG tablet Take 1 tablet (10 mg total) by mouth 4 (four) times daily. 10/09/14   Jodelle Gross, MD  nystatin (MYCOSTATIN) 100000 UNIT/ML suspension Take 5 mLs (500,000 Units total) by mouth 4 (four) times daily. 10/09/14   Jodelle Gross, MD  Omega-3 Fatty Acids (FISH OIL) 1200 MG CAPS Take 2,400 mg by mouth daily.     Historical Provider, MD  prochlorperazine (COMPAZINE) 10 MG tablet Take 1 tablet (10 mg total) by mouth every 6 (six) hours as needed for nausea or vomiting. 10/25/14   Jodelle Gross, MD    Current Facility-Administered Medications  Medication Dose Route Frequency Provider Last Rate Last Dose  . 0.9 %  sodium chloride infusion  Intravenous Continuous Reyne Dumas, MD      . docusate sodium (COLACE) capsule 100 mg  100 mg Oral BID Reyne Dumas, MD      . enoxaparin (LOVENOX) injection 40 mg  40 mg Subcutaneous Q24H Nayana Abrol, MD      . HYDROmorphone (DILAUDID) injection 0.5 mg  0.5 mg Intravenous Q3H PRN Reyne Dumas, MD      . levalbuterol (XOPENEX) nebulizer solution 0.63 mg  0.63 mg Nebulization Q6H PRN Reyne Dumas, MD      . ondansetron (ZOFRAN) tablet 4 mg  4 mg Oral Q6H PRN Reyne Dumas, MD       Or  . ondansetron (ZOFRAN) injection 4 mg  4 mg  Intravenous Q6H PRN Reyne Dumas, MD      . oxyCODONE (Oxy IR/ROXICODONE) immediate release tablet 5 mg  5 mg Oral Q4H PRN Reyne Dumas, MD      . senna (SENOKOT) tablet 8.6 mg  1 tablet Oral BID Reyne Dumas, MD      . sodium chloride 0.9 % injection 3 mL  3 mL Intravenous Q12H Reyne Dumas, MD        Allergies as of 10/29/2014 - Review Complete 10/25/2014  Allergen Reaction Noted  . Penicillins Other (See Comments)     Family History  Problem Relation Age of Onset  . Heart disease Mother     History   Social History  . Marital Status: Single    Spouse Name: N/A    Number of Children: N/A  . Years of Education: N/A   Occupational History  . retired    Social History Main Topics  . Smoking status: Current Every Day Smoker -- 1.00 packs/day for 45 years    Types: Cigarettes  . Smokeless tobacco: Never Used  . Alcohol Use: Yes     Comment: Occasional Wine  . Drug Use: No  . Sexual Activity: Not on file   Other Topics Concern  . Not on file   Social History Narrative    Review of Systems: Ten point ROS is O/W negative except as mentioned in HPI.  Physical Exam: Vital signs in last 24 hours: Temp:  [97.4 F (36.3 C)] 97.4 F (36.3 C) (12/22 1630) Pulse Rate:  [88] 88 (12/22 1630) Resp:  [16] 16 (12/22 1630) BP: (145)/(69) 145/69 mmHg (12/22 1630) SpO2:  [98 %] 98 % (12/22 1630) Weight:  [139 lb 3.2 oz (63.141 kg)] 139 lb 3.2 oz (63.141 kg) (12/22 1630)   General:   Alert, Well-developed, well-nourished, pleasant and cooperative in NAD Head:  Normocephalic and atraumatic. Eyes:  Scleral icterus noted. Ears:  Normal auditory acuity. Mouth:  No deformity or lesions.   Lungs:  Clear throughout to auscultation.  No wheezes, crackles, or rhonchi.  Heart:  Regular rate and rhythm; no murmurs, clicks, rubs, or gallops. Abdomen:  Soft, non-distended.  BS present.  Minimal epigastric TTP without R/R/G.   Rectal:  Deferred  Msk:  Symmetrical without gross  deformities. Pulses:  Normal pulses noted. Extremities:  Without clubbing or edema. Neurologic:  Alert and  oriented x4;  grossly normal neurologically. Skin:  Intact without significant lesions or rashes. Jaundice noted. Psych:  Alert and cooperative. Normal mood and affect.  IMPRESSION:  -Likely obstructive jaundice from metastatic lung cancer in a patient with known Stage IV lung CA with mets to the brain and bone (right humerus).  LFT's newly elevated with total bili of 15.  Could also be tumor burden within liver causing  impending liver failure.    PLAN: -LFT's and other labs (coags, etc) have been ordered by the hospitalists. -Will check CT scan of the abdomen and pelvis with contrast. -Will need ERCP likely with palliative stent, which we will plan for tentatively on 12/24 unless CT scan does not show obstruction.  ZEHR, JESSICA D.  10/29/2014, 4:36 PM  Pager number [phone]  GI Attending Note   Chart was reviewed and patient was examined. X-rays and lab were reviewed.    I agree with management and plans.  Sandy Salaam. Deatra Ina, M.D., [facility] Cell [phone]

## 2014-10-29 NOTE — Telephone Encounter (Signed)
Lft msg for pt confirming MD visit per 12/22 POF, mailed out sch.... Cherylann Banas

## 2014-10-29 NOTE — Progress Notes (Signed)
  Radiation Oncology         [phone] ________________________________  Name: Kristina Lawson MRN: [id number]  Date: 10/29/2014  DOB: December 30, 1945  SIMULATION AND TREATMENT PLANNING NOTE  DIAGNOSIS:  Metastatic non-small cell lung cancer  Site:  Bilateral cervical lymphadenopathy  NARRATIVE:  The patient was brought to the [place].  Identity was confirmed.  All relevant records and images related to the planned course of therapy were reviewed.   Written consent to proceed with treatment was confirmed which was freely given after reviewing the details related to the planned course of therapy had been reviewed with the patient.  Then, the patient was set-up in a stable reproducible  supine position for radiation therapy.  CT images were obtained.  Surface markings were placed.    Medically necessary complex treatment device(s) for immobilization:  Customized thermoplastic head cast.   The CT images were loaded into the planning software.  Then the target and avoidance structures were contoured.  Treatment planning then occurred.  The radiation prescription was entered and confirmed.  A total of 2 complex treatment devices were fabricated which relate to the designed radiation treatment fields. Each of these customized fields/ complex treatment devices will be used on a daily basis during the radiation course. I have requested : 3D Simulation  I have requested a DVH of the following structures: Gross tumor volume, left parotid, right carotid.   PLAN:  The patient will receive 30 Gy in 10 fractions.  ________________________________   Jodelle Gross, MD, PhD

## 2014-10-30 ENCOUNTER — Inpatient Hospital Stay (HOSPITAL_COMMUNITY): Payer: Medicare PPO

## 2014-10-30 ENCOUNTER — Encounter (HOSPITAL_COMMUNITY): Payer: Self-pay | Admitting: Radiology

## 2014-10-30 DIAGNOSIS — C801 Malignant (primary) neoplasm, unspecified: Secondary | ICD-10-CM

## 2014-10-30 DIAGNOSIS — R17 Unspecified jaundice: Secondary | ICD-10-CM | POA: Insufficient documentation

## 2014-10-30 DIAGNOSIS — K831 Obstruction of bile duct: Secondary | ICD-10-CM

## 2014-10-30 DIAGNOSIS — C3491 Malignant neoplasm of unspecified part of right bronchus or lung: Secondary | ICD-10-CM

## 2014-10-30 DIAGNOSIS — C787 Secondary malignant neoplasm of liver and intrahepatic bile duct: Principal | ICD-10-CM

## 2014-10-30 DIAGNOSIS — C7931 Secondary malignant neoplasm of brain: Secondary | ICD-10-CM

## 2014-10-30 DIAGNOSIS — J90 Pleural effusion, not elsewhere classified: Secondary | ICD-10-CM | POA: Insufficient documentation

## 2014-10-30 DIAGNOSIS — R590 Localized enlarged lymph nodes: Secondary | ICD-10-CM

## 2014-10-30 DIAGNOSIS — E43 Unspecified severe protein-calorie malnutrition: Secondary | ICD-10-CM | POA: Insufficient documentation

## 2014-10-30 LAB — COMPREHENSIVE METABOLIC PANEL
ALT: 139 U/L — AB (ref 0–35)
AST: 124 U/L — AB (ref 0–37)
Albumin: 2.2 g/dL — ABNORMAL LOW (ref 3.5–5.2)
Alkaline Phosphatase: 450 U/L — ABNORMAL HIGH (ref 39–117)
Anion gap: 7 (ref 5–15)
BUN: 13 mg/dL (ref 6–23)
CHLORIDE: 99 meq/L (ref 96–112)
CO2: 24 mmol/L (ref 19–32)
Calcium: 8.3 mg/dL — ABNORMAL LOW (ref 8.4–10.5)
Creatinine, Ser: 0.44 mg/dL — ABNORMAL LOW (ref 0.50–1.10)
GFR calc Af Amer: >90 mL/min (ref >90)
GFR calc non Af Amer: >90 mL/min (ref >90)
Glucose, Bld: 104 mg/dL — ABNORMAL HIGH (ref 70–99)
Potassium: 3.7 mmol/L (ref 3.5–5.1)
Sodium: 130 mmol/L — ABNORMAL LOW (ref 135–145)
Total Bilirubin: 14.6 mg/dL — ABNORMAL HIGH (ref 0.3–1.2)
Total Protein: 5.3 g/dL — ABNORMAL LOW (ref 6.0–8.3)

## 2014-10-30 LAB — CBC
HCT: 27.2 % — ABNORMAL LOW (ref 36.0–46.0)
Hemoglobin: 9.2 g/dL — ABNORMAL LOW (ref 12.0–15.0)
MCH: 28.8 pg (ref 26.0–34.0)
MCHC: 33.8 g/dL (ref 30.0–36.0)
MCV: 85.3 fL (ref 78.0–100.0)
PLATELETS: 328 10*3/uL (ref 150–400)
RBC: 3.19 MIL/uL — ABNORMAL LOW (ref 3.87–5.11)
RDW: 19.1 % — ABNORMAL HIGH (ref 11.5–15.5)
WBC: 7.8 10*3/uL (ref 4.0–10.5)

## 2014-10-30 MED ORDER — CIPROFLOXACIN IN D5W 400 MG/200ML IV SOLN
400.0000 mg | Freq: Two times a day (BID) | INTRAVENOUS | Status: DC
  Administered 2014-10-31 – 2014-11-01 (×3): 400 mg via INTRAVENOUS
  Filled 2014-10-30 (×3): qty 200

## 2014-10-30 MED ORDER — INDOMETHACIN 50 MG RE SUPP
50.0000 mg | Freq: Once | RECTAL | Status: DC
  Filled 2014-10-30 (×2): qty 1

## 2014-10-30 MED ORDER — IOHEXOL 300 MG/ML  SOLN
25.0000 mL | INTRAMUSCULAR | Status: AC
  Administered 2014-10-30: 25 mL via ORAL

## 2014-10-30 MED ORDER — IOHEXOL 300 MG/ML  SOLN
100.0000 mL | Freq: Once | INTRAMUSCULAR | Status: AC | PRN
  Administered 2014-10-30: 100 mL via INTRAVENOUS

## 2014-10-30 MED ORDER — ENOXAPARIN SODIUM 40 MG/0.4ML ~~LOC~~ SOLN: 40.0000 mg | SUBCUTANEOUS | Status: DC

## 2014-10-30 MED ORDER — BOOST PLUS PO LIQD
237.0000 mL | Freq: Three times a day (TID) | ORAL | Status: DC
  Administered 2014-10-30 – 2014-10-31 (×2): 237 mL via ORAL
  Filled 2014-10-30 (×7): qty 237

## 2014-10-30 NOTE — Progress Notes (Signed)
IP PROGRESS NOTE  Subjective:   68 year old woman known to me with history of stage IV lung cancer. She presented with brain metastasis and received radiation therapy. She has declined chemotherapy in the past. She was admitted on 10/29/2014 with painless jaundice and bilirubin of 15.6. She also has increased AST, ALT and alkaline phosphatase. She developed also neck adenopathy in currently receiving palliative radiation therapy.  Clinically she is feeling relatively well. She does report nausea and poor by mouth intake. She does report fatigue and tiredness. She does report some occasional dyspnea. She does not report any fevers or chills. Denied any other constitutional symptoms.  Objective:  Vital signs in last 24 hours: Temp:  [97.4 F (36.3 C)-98.1 F (36.7 C)] 98.1 F (36.7 C) (12/23 0514) Pulse Rate:  [88] 88 (12/23 0514) Resp:  [16-18] 16 (12/23 0514) BP: (134-145)/(69-72) 136/69 mmHg (12/23 0514) SpO2:  [96 %-100 %] 100 % (12/23 0514) Weight:  [139 lb 3.2 oz (63.141 kg)] 139 lb 3.2 oz (63.141 kg) (12/22 1630) Weight change:  Last BM Date: 10/29/14  Intake/Output from previous day:   Awake alert woman not in any distress. Clearly jaundiced with anicteric sclera. Mouth: mucous membranes moist, pharynx normal without lesions  Resp: clear to auscultation bilaterally Cardio: regular rate and rhythm, S1, S2 normal, no murmur, click, rub or gallop GI: soft, non-tender; bowel sounds normal; no masses,  no organomegaly Extremities: extremities normal, atraumatic, no cyanosis or edema   Lab Results:  Recent Labs  10/29/14 1753 10/30/14 0449  WBC 8.6 7.8  HGB 10.4* 9.2*  HCT 30.5* 27.2*  PLT 274 328    BMET  Recent Labs  10/29/14 1753 10/30/14 0449  NA 130* 130*  K 3.6 3.7  CL 99 99  CO2 22 24  GLUCOSE 99 104*  BUN 12 13  CREATININE 0.38* 0.44*  CALCIUM 8.8 8.3*    Medications: I have reviewed the patient's current  medications.  Assessment/Plan:  68 year old woman with the following issues:  1. Stage IV lung cancer diagnosed in August 2015. She presented with a right lung mass and brain metastasis. She received radiation therapy to the brain but declined systemic chemotherapy previously. She is willing to consider it at this time but this has to wait after the current issue have resolved. Her bilirubin has to normalize get close to normal for any consideration of systemic chemotherapy. This can be discussed further as an outpatient upon her discharge.  2. Jaundice: She is scheduled to have a CT scan to evaluate her for biliary obstruction. She does indeed have an obstruction, ERCP most likely the next step. It is very likely her jaundice is related to malignancy at this point.  3. Neck lymphadenopathy: She is currently receiving radiation therapy for palliative purposes.  Disposition: I have no objections for discharge after ERCP once deemed stable by gastroenterology. There are no plans for systemic treatment while she is in the hospital. I will arrange follow-up for her upon her discharge.   LOS: 1 day   Britain Anagnos 10/30/2014, 8:01 AM

## 2014-10-30 NOTE — Progress Notes (Signed)
Patient ID: Kristina Lawson, female   DOB: 1946-05-02, 68 y.o.   MRN: [id number] [facility] PROGRESS NOTE  Kristina Lawson EPP:[id number] DOB: Mar 22, 1946 DOA: 10/29/2014 PCP: [name], MD  Brief narrative:    68 year old female with stage IV lung cancer, brain metastasis diagnosed in 06/2014. Pt received RT to the brain, also receiving RT for cervical lymphadenopathy but has declined systemic chemotherapy previously. Patient presented to [facility] ED with painless jaundice, bilirubin of 15.6, elevated AST 136, ALT 154 and ALP 488.  CT abdomen on this admission showed marked progression of widespread metastatic disease involving the liver, spleen, pancreas, upper abdominal and retroperitoneal lymph nodes, mesentery, omentum, and bilateral ovaries.  Also seen was diffuse biliary ductal dilatation due to lymphadenopathy in the porta hepatis and pancreas; new large right lateral chest wall metastasis and small left lower lobe pulmonary metastases and new large right pleural effusion.   Assessment/Plan:    Principal Problem: Obstructive jaundice in the setting of lung cancer with liver metastases / bile duct obstruction   CT abdomen on this admission showed marked progression of widespread metastatic disease involving the liver, spleen, pancreas, upper abdominal and retroperitoneal lymph nodes, mesentery, omentum, and bilateral ovaries.  Also seen was diffuse biliary ductal dilatation due to lymphadenopathy in the porta hepatis and pancreas; new large right lateral chest wall metastasis and small left lower lobe pulmonary metastases and new large right pleural effusion.   ERCP planned for tomorrow am and perhaps she can go home after that.  Patient is on empiric cipro 400 mg IV Q 12 hours (cant see the reason why she needs to be on this antibiotic, no fevers and normal WBC count unless GI  recommends empiric treatment)  Active Problems: Stage IV lung caner with extensive metastatic disease /  large right pleural effusion  Pt will follow up with Dr. Alen Blew of oncology in regards to initiating chemotherapy  Order placed for CXR to further evaluate right pleural effusion    Protein-calorie malnutrition, severe  NPO post midnight for ERCP otherwise liberalize the diet   Anemia of chronic disease  In the setting of malignancy  Hemoglobin stable with no indications for transfusion   Hyponatremia  Likely dehydration versus SIADH from malignancy  May continue supportive care with IV fluids   DVT Prophylaxis   SCD's bilaterally   Code Status: Full.  Family Communication:  plan of care discussed with the patient Disposition Plan: Home when stable.   IV access:   Peripheral IV  Procedures and diagnostic studies:    Ct Abdomen Pelvis W Contrast 10/30/2014   Marked progression of widespread metastatic disease involving the liver, spleen, pancreas, upper abdominal and retroperitoneal lymph nodes, mesentery, omentum, and bilateral ovaries.  Diffuse biliary ductal dilatation due to lymphadenopathy in the porta hepatis and pancreas.  New large right lateral chest wall metastasis and small left lower lobe pulmonary metastases. New large right pleural effusion.   Medical Consultants:   Gastroenterology  Other Consultants:   None  IAnti-Infectives:    Cipro 10/29/2014 -->  Fluconazole 10/29/2014 -->    Kristina Lenz, MD  [facility] Pager [phone]  If 7PM-7AM, please contact night-coverage [email or web address] Password [email or web address] 10/30/2014, 5:03 PM   LOS: 1 day    HPI/Subjective: No acute overnight events.  Objective: Filed Vitals:   10/29/14 1630 10/29/14 2245 10/30/14 0514 10/30/14 1346  BP: 145/69 134/72 136/69 143/70  Pulse: 88 88 88 105  Temp: 97.4 F (36.3 C) 97.8 F (36.6 C) 98.1 F (36.7 C)  98.1 F (36.7 C)  TempSrc: Tympanic Oral Oral Oral  Resp: 16 18 16 18   Height: 5\' 3"  (1.6 m)     Weight: 63.141 kg (139 lb 3.2 oz)     SpO2: 98% 96% 100%  95%    Intake/Output Summary (Last 24 hours) at 10/30/14 1703 Last data filed at 10/30/14 1424  Gross per 24 hour  Intake      0 ml  Output    475 ml  Net   -475 ml    Exam:   General:  Pt is alert, follows commands appropriately, not in acute distress  Cardiovascular: Regular rate and rhythm, S1/S2, no murmurs  Respiratory: Clear to auscultation bilaterally, no wheezing, no crackles, no rhonchi  Abdomen: Soft, non tender, non distended, bowel sounds present  Extremities: No edema, pulses DP and PT palpable bilaterally  Neuro: Grossly nonfocal  Data Reviewed: Basic Metabolic Panel:  Recent Labs Lab 10/25/14 1515 10/29/14 1753 10/30/14 0449  NA 131* 130* 130*  K 3.6* 3.6 3.7  CL 92* 99 99  CO2 24 22 24   GLUCOSE 133* 99 104*  BUN 12 12 13   CREATININE 0.65 0.38* 0.44*  CALCIUM 9.6 8.8 8.3*  MG  --  1.8  --   PHOS  --  2.8  --    Liver Function Tests:  Recent Labs Lab 10/25/14 1515 10/29/14 1753 10/30/14 0449  AST 140* 136* 124*  ALT 181* 154* 139*  ALKPHOS 534* 488* 450*  BILITOT 15.2* 15.6* 14.6*  PROT 6.4 5.9* 5.3*  ALBUMIN 2.6* 2.4* 2.2*   No results for input(s): LIPASE, AMYLASE in the last 168 hours. No results for input(s): AMMONIA in the last 168 hours. CBC:  Recent Labs Lab 10/25/14 1515 10/29/14 1753 10/30/14 0449  WBC 9.3 8.6 7.8  NEUTROABS 7.5  --   --   HGB 11.4* 10.4* 9.2*  HCT 33.4* 30.5* 27.2*  MCV 86.1 85.7 85.3  PLT 371 274 328   Cardiac Enzymes: No results for input(s): CKTOTAL, CKMB, CKMBINDEX, TROPONINI in the last 168 hours. BNP: Invalid input(s): POCBNP CBG: No results for input(s): GLUCAP in the last 168 hours.  No results found for this or any previous visit (from the past 240 hour(s)).   Scheduled Meds: . [START ON 10/31/2014] ciprofloxacin  400 mg Intravenous Q12H  . docusate sodium  100 mg Oral BID  . fluconazole (DIFLUCAN) IV  100 mg Intravenous Q24H  . [START ON 10/31/2014] indomethacin  50 mg Rectal  Once  . lactose free nutrition  237 mL Oral TID WC  . senna  1 tablet Oral BID  . sodium chloride  3 mL Intravenous Q12H   Continuous Infusions: . sodium chloride 75 mL/hr at 10/30/14 1050

## 2014-10-30 NOTE — Progress Notes (Signed)
     [facility] Progress Note  Subjective:  Feels the same.  Has nausea but no significant abdominal pain.  No fevers.  Objective:  Vital signs in last 24 hours: Temp:  [97.4 F (36.3 C)-98.1 F (36.7 C)] 98.1 F (36.7 C) (12/23 0514) Pulse Rate:  [88] 88 (12/23 0514) Resp:  [16-18] 16 (12/23 0514) BP: (134-145)/(69-72) 136/69 mmHg (12/23 0514) SpO2:  [96 %-100 %] 100 % (12/23 0514) Weight:  [139 lb 3.2 oz (63.141 kg)] 139 lb 3.2 oz (63.141 kg) (12/22 1630) Last BM Date: 10/29/14 General:  Alert, Well-developed, in NAD; jaundice noted. Heart:  Regular rate and rhythm; no murmurs Pulm:  CTAB.  No W/R/R. Abdomen:  Soft, non-distended. BS present.  Minimal epigastric TTP without R/R/G.   Extremities:  Without edema. Neurologic:  Alert and  oriented x4;  grossly normal neurologically. Psych:  Alert and cooperative. Normal mood and affect.  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 Probably new metastatic disease in the liver.  LFT's newly elevated with total bili of 14.6 today.   -Monitor LFT's. -Awaiting CT scan of the abdomen and pelvis with contrast. -Will likely need ERCP with palliative stent for decompression, which we tentatively have planned for 12/24 at 10:30 am, pending results of CT scan.  Lovenox to be held.   LOS: 1 day   ZEHR, JESSICA D.   10/30/2014, 9:05 AM  Pager number [phone]  Pt seen and examined, x-rays were reviewed.  Ct demonstrates extensive infiltrative liver disease from mets, and enlarged nodes at the porta causing bile duct obstruction.  The latter can be palliated with biliary stenting.  ERCP scheduled for am.  If stable can be d/ced after procedure.

## 2014-10-30 NOTE — Progress Notes (Signed)
INITIAL NUTRITION ASSESSMENT  DOCUMENTATION CODES Per approved criteria  -Severe malnutrition in the context of chronic illness  Pt meets criteria for severe MALNUTRITION in the context of chronic illness as evidenced by 20% wt loss in <6 months and reported intake <75% of estimated needs for >1 month.  INTERVENTION: -Boost Plus TID -Encouraged small, frequent meals and snacks with focus on high-calorie, high-protein foods.   NUTRITION DIAGNOSIS: Inadequate oral intake related to nausea as evidenced by poor po and wt loss.   Goal: Pt to meet >/= 90% of their estimated nutrition needs   Monitor:  Weight trend, po intake, acceptance of supplements, labs  Reason for Assessment: MST  68 y.o. female  Admitting Dx: <principal problem not specified>  ASSESSMENT: 68 year old female with stage IV lung cancer, brain metastasis, diagnosed in August and recently started radiation therapy for cervical adenopathy, referred by Dr. Lisbeth Renshaw for admission because of abnormal labs and elevated bilirubin.  - Spoke with pt who reported feeling hungry and ready for diet to be advanced. She reports a weight loss of 34 lbs since August 2015. Pt reports a poor appetite and that she often has nausea. She was given ideas on ways to increase calories and protein in diet to prevent weight loss. Pt drinks Boost at home once daily and was advised to increase to 2-3/day.  - Pt with no signs of fat or muscle wasting.   Labs: Na low K WNL BUN WNL Phos and Mg WNL Albumin low  Height: Ht Readings from Last 1 Encounters:  10/29/14 5\' 3"  (1.6 m)    Weight: Wt Readings from Last 1 Encounters:  10/29/14 139 lb 3.2 oz (63.141 kg)    Ideal Body Weight: 52.4 kg  % Ideal Body Weight: 120%  Wt Readings from Last 10 Encounters:  10/29/14 139 lb 3.2 oz (63.141 kg)  10/09/14 145 lb 6.4 oz (65.953 kg)  09/12/14 155 lb 8 oz (70.534 kg)  08/08/14 158 lb 6.4 oz (71.85 kg)  07/31/14 166 lb 12.8 oz (75.66 kg)   07/18/14 166 lb 4.8 oz (75.433 kg)  07/17/14 165 lb 11.2 oz (75.161 kg)  07/05/14 162 lb (73.483 kg)  06/28/14 168 lb (76.204 kg)  06/25/14 169 lb 8 oz (76.885 kg)    Usual Body Weight: 173 lbs  % Usual Body Weight: 80%  BMI:  Body mass index is 24.66 kg/(m^2).  Estimated Nutritional Needs: Kcal: 1800-2000 Protein: 90-105 g Fluid: 1.8-2.0 L/day  Skin: Intact  Diet Order: Diet NPO time specified Diet NPO time specified  EDUCATION NEEDS: -Education needs addressed   Intake/Output Summary (Last 24 hours) at 10/30/14 1200 Last data filed at 10/30/14 1100  Gross per 24 hour  Intake      0 ml  Output    250 ml  Net   -250 ml    Last BM: prior to admission   Labs:   Recent Labs Lab 10/25/14 1515 10/29/14 1753 10/30/14 0449  NA 131* 130* 130*  K 3.6* 3.6 3.7  CL 92* 99 99  CO2 24 22 24   BUN 12 12 13   CREATININE 0.65 0.38* 0.44*  CALCIUM 9.6 8.8 8.3*  MG  --  1.8  --   PHOS  --  2.8  --   GLUCOSE 133* 99 104*    CBG (last 3)  No results for input(s): GLUCAP in the last 72 hours.  Scheduled Meds: . docusate sodium  100 mg Oral BID  . fluconazole (DIFLUCAN) IV  100 mg  Intravenous Q24H  . senna  1 tablet Oral BID  . sodium chloride  3 mL Intravenous Q12H    Continuous Infusions: . sodium chloride 75 mL/hr at 10/30/14 1050    Past Medical History  Diagnosis Date  . Hypertension   . Smoker   . Hyperlipidemia   . Brain cancer 06/17/14    multiple brain metastses  . 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 Seizures 06/17/14    witness at home brought to the ED  . S/P radiation therapy 07/16/14-07/31/14/palliative    brain,left base&infratemporal .chest/meiastinum,proxiaml rt humerus    Past Surgical History  Procedure Laterality Date  . Ct biopsy  06/19/14    Left adrenal mass    Laurette Schimke MS, RD, LDN

## 2014-10-31 ENCOUNTER — Inpatient Hospital Stay (HOSPITAL_COMMUNITY): Payer: Medicare PPO | Admitting: Registered Nurse

## 2014-10-31 ENCOUNTER — Inpatient Hospital Stay (HOSPITAL_COMMUNITY): Payer: Medicare PPO

## 2014-10-31 ENCOUNTER — Encounter (HOSPITAL_COMMUNITY): Payer: Self-pay

## 2014-10-31 ENCOUNTER — Encounter (HOSPITAL_COMMUNITY)
Admission: AD | Disposition: A | Discharge status: Home / Self Care | Payer: Self-pay | Source: Ambulatory Visit | Attending: Internal Medicine

## 2014-10-31 DIAGNOSIS — K838 Other specified diseases of biliary tract: Secondary | ICD-10-CM

## 2014-10-31 DIAGNOSIS — R17 Unspecified jaundice: Secondary | ICD-10-CM | POA: Insufficient documentation

## 2014-10-31 DIAGNOSIS — K831 Obstruction of bile duct: Secondary | ICD-10-CM

## 2014-10-31 HISTORY — PX: ENDOSCOPIC RETROGRADE CHOLANGIOPANCREATOGRAPHY (ERCP) WITH PROPOFOL: SHX5810

## 2014-10-31 HISTORY — PX: ERCP: SHX5425

## 2014-10-31 SURGERY — ERCP, WITH INTERVENTION IF INDICATED
Anesthesia: General

## 2014-10-31 SURGERY — ENDOSCOPIC RETROGRADE CHOLANGIOPANCREATOGRAPHY (ERCP) WITH PROPOFOL
Anesthesia: Monitor Anesthesia Care

## 2014-10-31 MED ORDER — PROPOFOL 10 MG/ML IV BOLUS
INTRAVENOUS | Status: AC
  Filled 2014-10-31: qty 20

## 2014-10-31 MED ORDER — IOHEXOL 300 MG/ML  SOLN
25.0000 mL | Freq: Once | INTRAMUSCULAR | Status: AC | PRN
  Administered 2014-10-31: 30 mL

## 2014-10-31 MED ORDER — MIDAZOLAM HCL 2 MG/2ML IJ SOLN
INTRAMUSCULAR | Status: AC
  Filled 2014-10-31: qty 2

## 2014-10-31 MED ORDER — LACTATED RINGERS IV SOLN: INTRAVENOUS | Status: DC

## 2014-10-31 MED ORDER — GLUCAGON HCL RDNA (DIAGNOSTIC) 1 MG IJ SOLR
INTRAMUSCULAR | Status: DC | PRN
  Administered 2014-10-31: 1 mg via INTRAVENOUS

## 2014-10-31 MED ORDER — CIPROFLOXACIN IN D5W 400 MG/200ML IV SOLN: 400.0000 mg | Freq: Once | INTRAVENOUS | Status: DC

## 2014-10-31 MED ORDER — FENTANYL CITRATE 0.05 MG/ML IJ SOLN
INTRAMUSCULAR | Status: AC
  Filled 2014-10-31: qty 2

## 2014-10-31 MED ORDER — LACTATED RINGERS IV SOLN
INTRAVENOUS | Status: DC | PRN
  Administered 2014-10-31: 11:00:00 via INTRAVENOUS

## 2014-10-31 MED ORDER — LACTATED RINGERS IV SOLN
INTRAVENOUS | Status: DC | PRN
  Administered 2014-10-31 (×2): via INTRAVENOUS

## 2014-10-31 MED ORDER — HYDRALAZINE HCL 20 MG/ML IJ SOLN
5.0000 mg | Freq: Four times a day (QID) | INTRAMUSCULAR | Status: DC
  Administered 2014-10-31 – 2014-11-01 (×2): 5 mg via INTRAVENOUS
  Filled 2014-10-31 (×2): qty 0.25
  Filled 2014-10-31: qty 1
  Filled 2014-10-31: qty 0.25
  Filled 2014-10-31: qty 1
  Filled 2014-10-31: qty 0.25

## 2014-10-31 MED ORDER — FENTANYL CITRATE 0.05 MG/ML IJ SOLN
INTRAMUSCULAR | Status: DC | PRN
  Administered 2014-10-31: 50 ug via INTRAVENOUS

## 2014-10-31 MED ORDER — PROPOFOL INFUSION 10 MG/ML OPTIME
INTRAVENOUS | Status: DC | PRN
  Administered 2014-10-31: 100 ug/kg/min via INTRAVENOUS

## 2014-10-31 MED ORDER — GLYCOPYRROLATE 0.2 MG/ML IJ SOLN
INTRAMUSCULAR | Status: DC | PRN
  Administered 2014-10-31: 0.2 mg via INTRAVENOUS

## 2014-10-31 MED ORDER — SODIUM CHLORIDE 0.9 % IV SOLN: INTRAVENOUS | Status: DC

## 2014-10-31 MED ORDER — GLUCAGON HCL RDNA (DIAGNOSTIC) 1 MG IJ SOLR
INTRAMUSCULAR | Status: AC
  Filled 2014-10-31: qty 1

## 2014-10-31 MED ORDER — PROMETHAZINE HCL 25 MG/ML IJ SOLN: 6.2500 mg | INTRAMUSCULAR | Status: DC | PRN

## 2014-10-31 MED ORDER — CIPROFLOXACIN IN D5W 400 MG/200ML IV SOLN
INTRAVENOUS | Status: AC
  Filled 2014-10-31: qty 200

## 2014-10-31 MED ORDER — LIDOCAINE HCL (CARDIAC) 20 MG/ML IV SOLN
INTRAVENOUS | Status: AC
  Filled 2014-10-31: qty 5

## 2014-10-31 MED ORDER — LIDOCAINE HCL 1 % IJ SOLN
INTRAMUSCULAR | Status: AC
  Filled 2014-10-31: qty 20

## 2014-10-31 MED ORDER — CIPROFLOXACIN IN D5W 400 MG/200ML IV SOLN: 400.0000 mg | Freq: Two times a day (BID) | INTRAVENOUS | Status: DC

## 2014-10-31 MED ORDER — FENTANYL CITRATE 0.05 MG/ML IJ SOLN
INTRAMUSCULAR | Status: AC | PRN
  Administered 2014-10-31 (×2): 50 ug via INTRAVENOUS

## 2014-10-31 MED ORDER — MEPERIDINE HCL 50 MG/ML IJ SOLN: 6.2500 mg | INTRAMUSCULAR | Status: DC | PRN

## 2014-10-31 MED ORDER — MIDAZOLAM HCL 5 MG/5ML IJ SOLN
INTRAMUSCULAR | Status: DC | PRN
  Administered 2014-10-31: 1 mg via INTRAVENOUS

## 2014-10-31 MED ORDER — GLYCOPYRROLATE 0.2 MG/ML IJ SOLN
INTRAMUSCULAR | Status: AC
  Filled 2014-10-31: qty 1

## 2014-10-31 MED ORDER — ONDANSETRON HCL 4 MG/2ML IJ SOLN
INTRAMUSCULAR | Status: AC
  Filled 2014-10-31: qty 2

## 2014-10-31 MED ORDER — MIDAZOLAM HCL 2 MG/2ML IJ SOLN
INTRAMUSCULAR | Status: AC | PRN
  Administered 2014-10-31 (×2): 1 mg via INTRAVENOUS

## 2014-10-31 MED ORDER — INDOMETHACIN 50 MG RE SUPP
50.0000 mg | Freq: Once | RECTAL | Status: DC
  Filled 2014-10-31: qty 1

## 2014-10-31 MED ORDER — SODIUM CHLORIDE 0.9 % IV SOLN
INTRAVENOUS | Status: DC | PRN
  Administered 2014-10-31: 35 mL

## 2014-10-31 MED ORDER — PROPOFOL INFUSION 10 MG/ML OPTIME
INTRAVENOUS | Status: DC | PRN
Start: 2014-10-31 — End: 2014-10-31
  Administered 2014-10-31: 100 ug/kg/min via INTRAVENOUS

## 2014-10-31 MED ORDER — LIDOCAINE HCL (CARDIAC) 20 MG/ML IV SOLN
INTRAVENOUS | Status: DC | PRN
  Administered 2014-10-31: 50 mg via INTRAVENOUS

## 2014-10-31 MED ORDER — HYDRALAZINE HCL 20 MG/ML IJ SOLN: 10.0000 mg | Freq: Four times a day (QID) | INTRAMUSCULAR | Status: DC | PRN

## 2014-10-31 NOTE — Progress Notes (Signed)
IP PROGRESS NOTE  Subjective:   No major changes overnight. She is feeling well without any nausea, vomiting or abdominal pain. She did not report any fevers or chills or sweats. Able to breathe fairly well without any dyspnea or stridor.  Objective:  Vital signs in last 24 hours: Temp:  [97.9 F (36.6 C)-98.2 F (36.8 C)] 98.2 F (36.8 C) (12/24 0526) Pulse Rate:  [98-105] 99 (12/24 0526) Resp:  [16-18] 16 (12/24 0526) BP: (143-150)/(64-78) 148/64 mmHg (12/24 0526) SpO2:  [95 %-100 %] 100 % (12/24 0526) Weight change:  Last BM Date: 10/29/14  Intake/Output from previous day: 12/23 0701 - 12/24 0700 In: 1816 [I.V.:1766; IV Piggyback:50] Out: 475 [Urine:475] Awake alert woman not in any distress. Clearly jaundiced with anicteric sclera. Mouth: mucous membranes moist, pharynx normal without lesions  Resp: clear to auscultation bilaterally Cardio: regular rate and rhythm, S1, S2 normal, no murmur, click, rub or gallop GI: soft, non-tender; bowel sounds normal; no masses,  no organomegaly Extremities: extremities normal, atraumatic, no cyanosis or edema   Lab Results:  Recent Labs  10/29/14 1753 10/30/14 0449  WBC 8.6 7.8  HGB 10.4* 9.2*  HCT 30.5* 27.2*  PLT 274 328    BMET  Recent Labs  10/29/14 1753 10/30/14 0449  NA 130* 130*  K 3.6 3.7  CL 99 99  CO2 22 24  GLUCOSE 99 104*  BUN 12 13  CREATININE 0.38* 0.44*  CALCIUM 8.8 8.3*    CLINICAL DATA: Metastatic lung carcinoma. Painless jaundice. Elevated liver function tests.  EXAM: CT ABDOMEN AND PELVIS WITH CONTRAST  TECHNIQUE: Multidetector CT imaging of the abdomen and pelvis was performed using the standard protocol following bolus administration of intravenous contrast.  CONTRAST: 131mL OMNIPAQUE IOHEXOL 300 MG/ML SOLN, 1 OMNIPAQUE IOHEXOL 300 MG/ML SOLN  COMPARISON: 06/18/2014  FINDINGS: Lower Chest: New right pleural effusion and right lower lung atelectasis. Incomplete  visualization of mediastinal lymphadenopathy. New right lateral chest wall soft tissue mass measuring 3.4 x 6.1 cm on image 9. Increased pulmonary nodules seen in the lateral left lung base measuring up to 13 mm, consistent with pulmonary metastases.  Hepatobiliary: New diffuse liver metastases, with index lesion in the peripheral right hepatic lobe measuring 3.8 cm on image 25. New diffuse biliary ductal dilatation is also seen due to lymphadenopathy in the porta hepatis and peripancreatic region.  Pancreas: Multiple new soft tissue masses are seen involving the pancreas diffusely which may be due to peripancreatic lymphadenopathy or pancreatic metastases. Pancreatic ductal dilatation noted in the pancreatic body.  Spleen: Several new small low-attenuation splenic lesions are seen, consistent with splenic metastases.  Adrenal Glands: Left adrenal mass measures 3.2 x 3.0 cm compared to 3.2 x 3.4 cm on previous study.  Kidneys/Urinary Tract: Horseshoe kidney again noted. No evidence of renal masses or hydronephrosis.  Stomach/Bowel/Peritoneum: No evidence of bowel obstruction. Mild ascites is seen. Two solid masses are seen within the omental fat measuring 2.0 cm on image 58 and 2.8 cm on image 74.  Vascular/Lymphatic: New bulky lymphadenopathy in the porta hepatis, peripancreatic regions, abdominal retroperitoneum, and small bowel mesentery.  Reproductive: New large bilateral adnexal cystic and solid masses are seen measuring 6.9 x 8.6 cm on the right and 6.4 x 8.9 cm on the left. These are consistent with ovarian metastases.  Other: None.  Musculoskeletal: No suspicious bone lesions identified.  IMPRESSION: Marked progression of widespread metastatic disease involving the liver, spleen, pancreas, upper abdominal and retroperitoneal lymph nodes, mesentery, omentum, and bilateral ovaries.  Diffuse biliary ductal dilatation due to lymphadenopathy in  the porta hepatis and pancreas.  New large right lateral chest wall metastasis and small left lower lobe pulmonary metastases. New large right pleural effusion.    Medications: I have reviewed the patient's current medications.  Assessment/Plan:  68 year old woman with the following issues:  1. Stage IV lung cancer diagnosed in August 2015. She presented with a right lung mass and brain metastasis. She received radiation therapy to the brain but declined systemic chemotherapy previously.   The results of the CT scan on 10/30/2014 was discussed today with the patient and her son. The scan showed marked progression of her disease with widespread metastasis including abdominal adenopathy among others. She understands that chemotherapy would be palliative at best and will not be able to eradicate this cancer completely.She is willing to consider it at this time but this has to wait after the current issue have resolved. Her bilirubin has to normalize get close to normal for any consideration of systemic chemotherapy.  2. Jaundice: She is scheduled to have ERCP in the near future and attempt to alleviate the obstruction.  3. Neck lymphadenopathy: She is currently receiving radiation therapy for palliative purposes.  4. Follow-up: She has follow-up scheduled on 11/07/2014.     LOS: 2 days   Kristina Lawson 10/31/2014, 8:05 AM

## 2014-10-31 NOTE — Progress Notes (Signed)
Through a rendezvous technique a metal biliary stent was successfully placed through the obstructed bile duct.  Note patient has a partial duodenal obstruction from which she is not yet symptomatic.  Plan to continue antibiotics until a.m. and advance diet.  If patient feels well she can be discharged in a.m. As an outpatient we will have to watch for signs of gastric outlet obstruction.  Should this occur then she will require a duodenal stent.

## 2014-10-31 NOTE — Anesthesia Preprocedure Evaluation (Signed)
Anesthesia Evaluation  Patient identified by MRN, date of birth, ID band Patient awake  General Assessment Comment:. Hypertension  . Smoker  . Hyperlipidemia  . Brain cancer 06/17/14   multiple brain metastses . 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 Seizures 06/17/14   witness at home brought to the ED . S/P radiation therapy 07/16/14-07/31/14/palliative   brain,left base&infratemporal .chest/meiastinum,proxiaml rt humerus     Reviewed: Allergy & Precautions, H&P , NPO status , Patient's Chart, lab work & pertinent test results  Airway Mallampati: II  TM Distance: >3 FB Neck ROM: Full    Dental  (+) Upper Dentures, Lower Dentures   Pulmonary former smoker,  breath sounds clear to auscultation Decreased breath sounds right side. Pulmonary exam normal + decreased breath sounds      Cardiovascular Exercise Tolerance: Poor hypertension, Pt. on medications and Pt. on home beta blockers Rhythm:Regular Rate:Normal     Neuro/Psych Seizures -,  negative psych ROS   GI/Hepatic negative GI ROS, Neg liver ROS,   Endo/Other  negative endocrine ROS  Renal/GU negative Renal ROS  negative genitourinary   Musculoskeletal negative musculoskeletal ROS (+)   Abdominal   Peds negative pediatric ROS (+)  Hematology negative hematology ROS (+)   Anesthesia Other Findings   Reproductive/Obstetrics negative OB ROS                             Anesthesia Physical  Anesthesia Plan  ASA: IV  Anesthesia Plan: General and MAC   Post-op Pain Management:    Induction: Intravenous  Airway Management Planned:   Additional Equipment:   Intra-op Plan:   Post-operative Plan: Extubation in OR and Possible Post-op intubation/ventilation  Informed Consent: I have reviewed the patients History and Physical, chart, labs and discussed the  procedure including the risks, benefits and alternatives for the proposed anesthesia with the patient or authorized representative who has indicated his/her understanding and acceptance.   Dental advisory given  Plan Discussed with: CRNA  Anesthesia Plan Comments: (Discussed MAC and general. Higher risk with lung cancer and right pleural effusion.)        Anesthesia Quick Evaluation

## 2014-10-31 NOTE — Interval H&P Note (Signed)
History and Physical Interval Note:  10/31/2014 10:47 AM  Thad Ranger  has presented today for surgery, with the diagnosis of Jaundice  The various methods of treatment have been discussed with the patient and family. After consideration of risks, benefits and other options for treatment, the patient has consented to  Procedure(s): ENDOSCOPIC RETROGRADE CHOLANGIOPANCREATOGRAPHY (ERCP) (N/A) as a surgical intervention .  The patient's history has been reviewed, patient examined, no change in status, stable for surgery.  I have reviewed the patient's chart and labs.  Questions were answered to the patient's satisfaction.    The recent H&P (dated *10/30/14  **) was reviewed, the patient was examined and there is no change in the patients condition since that H&P was completed.   Erskine Emery  10/31/2014, 10:48 AM    Erskine Emery

## 2014-10-31 NOTE — Procedures (Signed)
Interventional Radiology Procedure Note  Procedure: Left PTC and successful internalization of a catheter and wire beyond the distal CBD obstruction and into the duodenum. Complications: None Recommendations: 1.) To endo for completion of rendezvous procedure  Signed,  Criselda Peaches, MD

## 2014-10-31 NOTE — Anesthesia Postprocedure Evaluation (Signed)
  Anesthesia Post-op Note  Patient: Kristina Lawson  Procedure(s) Performed: Procedure(s) (LRB): ENDOSCOPIC RETROGRADE CHOLANGIOPANCREATOGRAPHY (ERCP) WITH PROPOFOL (N/A)  Patient Location: PACU  Anesthesia Type: MAC  Level of Consciousness: awake and alert   Airway and Oxygen Therapy: Patient Spontanous Breathing  Post-op Pain: mild  Post-op Assessment: Post-op Vital signs reviewed, Patient's Cardiovascular Status Stable, Respiratory Function Stable, Patent Airway and No signs of Nausea or vomiting  Last Vitals:  Filed Vitals:   10/31/14 1700  BP: 170/85  Pulse: 113  Temp: 36.2 C  Resp: 16    Post-op Vital Signs: stable   Complications: No apparent anesthesia complications

## 2014-10-31 NOTE — Progress Notes (Addendum)
Patient ID: Kristina Lawson, female   DOB: [birth year], 68 y.o.   MRN: [id number] [facility] PROGRESS NOTE  Kristina Lawson TDS:[id number] DOB: [birth year] DOA: 10/29/2014 PCP: [name], MD  Brief narrative:    68 year old female with stage IV lung cancer, brain metastasis diagnosed in 06/2014. Pt received RT to the brain, also receiving RT for cervical lymphadenopathy but has declined systemic chemotherapy previously. Patient presented to [facility] ED with painless jaundice, bilirubin of 15.6, elevated AST 136, ALT 154 and ALP 488. CT abdomen on this admission showed marked progression of widespread metastatic disease involving the liver, spleen, pancreas, upper abdominal and retroperitoneal lymph nodes, mesentery, omentum, and bilateral ovaries. Also seen was diffuse biliary ductal dilatation due to lymphadenopathy in the porta hepatis and pancreas; new large right lateral chest wall metastasis and small left lower lobe pulmonary metastases and new large right pleural effusion.   Assessment/Plan:     Principal Problem: Obstructive jaundice in the setting of lung cancer with liver metastases / bile duct obstruction   CT abdomen on this admission showed marked progression of widespread metastatic disease involving the liver, spleen, pancreas, upper abdominal and retroperitoneal lymph nodes, mesentery, omentum, and bilateral ovaries. Also seen was diffuse biliary ductal dilatation due to lymphadenopathy in the porta hepatis and pancreas; new large right lateral chest wall metastasis and small left lower lobe pulmonary metastases and new large right pleural effusion.   ERCP planned for today. Will follow on results.  Patient is on empiric cipro 400 mg IV Q 12 hours (cant see the reason why she needs to be on this antibiotic, no fevers and normal WBC count unless GI recommends empiric treatment)  Active Problems: Stage IV lung caner with extensive metastatic disease / large right pleural  effusion  Pt will follow up with Dr. Alen Blew of oncology in regards to initiating chemotherapy  CXR showed large pleural effusion on the right so order placed for thoracentesis and for labs to be sent for analysis.   Protein-calorie malnutrition, severe  NPO post midnight for ERCP. After ERCP can advance the diet.   Anemia of chronic disease  In the setting of malignancy  Hemoglobin stable with no indications for transfusion  Hyponatremia  Likely dehydration versus SIADH from malignancy  DVT Prophylaxis   SCD's bilaterally  Code Status: Full.  Family Communication: plan of care discussed with the patient Disposition Plan: Home when stable.    IV access:  Peripheral IV  Procedures and diagnostic studies:    Ct Abdomen Pelvis W Contrast 10/30/2014   Marked progression of widespread metastatic disease involving the liver, spleen, pancreas, upper abdominal and retroperitoneal lymph nodes, mesentery, omentum, and bilateral ovaries.  Diffuse biliary ductal dilatation due to lymphadenopathy in the porta hepatis and pancreas.  New large right lateral chest wall metastasis and small left lower lobe pulmonary metastases. New large right pleural effusion.    Dg Chest Port 1 View 10/30/2014   Large right pleural effusion.  Addendum to procedures done today. Ir Biliary Dilatation 10/31/2014  1. Percutaneous transhepatic cholangiogram demonstrates high-grade distal common bile duct obstruction and dilatation of the intra and extrahepatic biliary tree. 2. Successful internalization and dilatation of the distal common bile duct stricture to 4 [place] using a 4 [place] glide catheter and wire combination. 3. 270 cm super stiff glidewire left across of the stenosis and into the proximal small bowel for planned rendezvous stenting. Signed,  Criselda Peaches, MD  [facility]  [facility]   Electronically  Signed   By: Jacqulynn Cadet M.D.   On:  10/31/2014 14:29   Ir Ptc 10/31/2014    1. Percutaneous transhepatic cholangiogram demonstrates high-grade distal common bile duct obstruction and dilatation of the intra and extrahepatic biliary tree. 2. Successful internalization and dilatation of the distal common bile duct stricture to 4 [place] using a 4 [place] glide catheter and wire combination. 3. 270 cm super stiff glidewire left across of the stenosis and into the proximal small bowel for planned rendezvous stenting. Signed,  Criselda Peaches, MD  [facility]  [facility]   Electronically Signed   By: Jacqulynn Cadet M.D.   On: 10/31/2014 14:29   Ir US Guide Bx Asp/drain 10/31/2014   1. Percutaneous transhepatic cholangiogram demonstrates high-grade distal common bile duct obstruction and dilatation of the intra and extrahepatic biliary tree. 2. Successful internalization and dilatation of the distal common bile duct stricture to 4 [place] using a 4 [place] glide catheter and wire combination. 3. 270 cm super stiff glidewire left across of the stenosis and into the proximal small bowel for planned rendezvous stenting.  Dg Ercp 10/31/2014   The biliary system was not successfully opacified with contrast. Location of the injected contrast is unknown.  These images were submitted for radiologic interpretation only. Please see the procedural report for the amount of contrast and the fluoroscopy time utilized.    Medical Consultants:  Gastroenterology Interventional radiology   Other Consultants:  None   IAnti-Infectives:   Cipro 10/29/2014 --> Fluconazole 10/29/2014 -->   Leisa Lenz, MD  [facility] Pager [phone]  If 7PM-7AM, please contact night-coverage [email or web address] Password [email or web address] 10/31/2014, 3:46 PM   LOS: 2 days    HPI/Subjective: No acute overnight events.  Objective: Filed Vitals:   10/31/14 1344 10/31/14 1347 10/31/14 1350 10/31/14 1353  BP: 159/94 174/108  187/101 186/110  Pulse: 109 109 109 110  Temp:      TempSrc:      Resp: 11 14 15 18   Height:      Weight:      SpO2: 90% 96% 95% 94%    Intake/Output Summary (Last 24 hours) at 10/31/14 1546 Last data filed at 10/31/14 1539  Gross per 24 hour  Intake   3416 ml  Output      0 ml  Net   3416 ml    Exam:   General:  Pt is not in acute distress  Cardiovascular: Regular rate and rhythm, S1/S2 appreciated   Respiratory: no wheezing, no crackles, no rhonchi  Abdomen: Soft, non tender, non distended, bowel sounds present  Extremities: No edema, pulses DP and PT palpable bilaterally  Data Reviewed: Basic Metabolic Panel:  Recent Labs Lab 10/25/14 1515 10/29/14 1753 10/30/14 0449  NA 131* 130* 130*  K 3.6* 3.6 3.7  CL 92* 99 99  CO2 24 22 24   GLUCOSE 133* 99 104*  BUN 12 12 13   CREATININE 0.65 0.38* 0.44*  CALCIUM 9.6 8.8 8.3*  MG  --  1.8  --   PHOS  --  2.8  --    Liver Function Tests:  Recent Labs Lab 10/25/14 1515 10/29/14 1753 10/30/14 0449  AST 140* 136* 124*  ALT 181* 154* 139*  ALKPHOS 534* 488* 450*  BILITOT 15.2* 15.6* 14.6*  PROT 6.4 5.9* 5.3*  ALBUMIN 2.6* 2.4* 2.2*   No results for input(s): LIPASE, AMYLASE in the last 168 hours. No results for input(s): AMMONIA in the last 168 hours. CBC:  Recent Labs Lab 10/25/14 1515 10/29/14 1753 10/30/14 0449  WBC 9.3 8.6 7.8  NEUTROABS 7.5  --   --   HGB 11.4* 10.4* 9.2*  HCT 33.4* 30.5* 27.2*  MCV 86.1 85.7 85.3  PLT 371 274 328   Cardiac Enzymes: No results for input(s): CKTOTAL, CKMB, CKMBINDEX, TROPONINI in the last 168 hours. BNP: Invalid input(s): POCBNP CBG: No results for input(s): GLUCAP in the last 168 hours.  No results found for this or any previous visit (from the past 240 hour(s)).   Scheduled Meds: . [MAR Hold] ciprofloxacin  400 mg Intravenous Q12H  . [MAR Hold] docusate sodium  100 mg Oral BID  . [MAR Hold] fluconazole (DIFLUCAN) IV  100 mg Intravenous Q24H  .  hydrALAZINE  5 mg Intravenous Q6H  . indomethacin  50 mg Rectal Once  . [MAR Hold] lactose free nutrition  237 mL Oral TID WC  . [MAR Hold] senna  1 tablet Oral BID  . [MAR Hold] sodium chloride  3 mL Intravenous Q12H   Continuous Infusions: . sodium chloride 75 mL/hr at 10/31/14 0113

## 2014-10-31 NOTE — Anesthesia Postprocedure Evaluation (Signed)
  Anesthesia Post-op Note  Patient: Kristina Lawson  Procedure(s) Performed: Procedure(s) (LRB): ENDOSCOPIC RETROGRADE CHOLANGIOPANCREATOGRAPHY (ERCP) (N/A)  Patient Location: PACU  Anesthesia Type: MAC  Level of Consciousness: awake and alert   Airway and Oxygen Therapy: Patient Spontanous Breathing  Post-op Pain: mild  Post-op Assessment: Post-op Vital signs reviewed, Patient's Cardiovascular Status Stable, Respiratory Function Stable, Patent Airway and No signs of Nausea or vomiting  Last Vitals:  Filed Vitals:   10/31/14 1700  BP: 170/85  Pulse: 113  Temp: 36.2 C  Resp: 16    Post-op Vital Signs: stable   Complications: No apparent anesthesia complications

## 2014-10-31 NOTE — Transfer of Care (Signed)
Immediate Anesthesia Transfer of Care Note  Patient: Kristina Lawson  Procedure(s) Performed: Procedure(s): ENDOSCOPIC RETROGRADE CHOLANGIOPANCREATOGRAPHY (ERCP) (N/A)  Patient Location: PACU and Endoscopy Unit  Anesthesia Type:MAC  Level of Consciousness: awake, alert , oriented and patient cooperative  Airway & Oxygen Therapy: Patient Spontanous Breathing and Patient connected to nasal cannula oxygen  Post-op Assessment: Report given to PACU RN, Post -op Vital signs reviewed and stable and Patient moving all extremities  Post vital signs: Reviewed and stable  Complications: No apparent anesthesia complications

## 2014-10-31 NOTE — Anesthesia Preprocedure Evaluation (Addendum)
Anesthesia Evaluation  Patient identified by MRN, date of birth, ID band Patient awake  General Assessment Comment:. Hypertension  . Smoker  . Hyperlipidemia  . Brain cancer 06/17/14   multiple brain metastses . 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 Seizures 06/17/14   witness at home brought to the ED . S/P radiation therapy 07/16/14-07/31/14/palliative   brain,left base&infratemporal .chest/meiastinum,proxiaml rt humerus     Reviewed: Allergy & Precautions, H&P , NPO status , Patient's Chart, lab work & pertinent test results  Airway Mallampati: II  TM Distance: >3 FB Neck ROM: Full    Dental  (+) Upper Dentures, Lower Dentures   Pulmonary former smoker,  breath sounds clear to auscultation Decreased breath sounds right side. Pulmonary exam normal + decreased breath sounds      Cardiovascular Exercise Tolerance: Poor hypertension, Pt. on medications and Pt. on home beta blockers Rhythm:Regular Rate:Normal     Neuro/Psych Seizures -,  negative psych ROS   GI/Hepatic negative GI ROS, Neg liver ROS,   Endo/Other  negative endocrine ROS  Renal/GU negative Renal ROS  negative genitourinary   Musculoskeletal negative musculoskeletal ROS (+)   Abdominal   Peds negative pediatric ROS (+)  Hematology negative hematology ROS (+)   Anesthesia Other Findings   Reproductive/Obstetrics negative OB ROS                            Anesthesia Physical Anesthesia Plan  ASA: IV  Anesthesia Plan: General   Post-op Pain Management:    Induction: Intravenous  Airway Management Planned:   Additional Equipment:   Intra-op Plan:   Post-operative Plan: Extubation in OR and Possible Post-op intubation/ventilation  Informed Consent: I have reviewed the patients History and Physical, chart, labs and discussed the procedure  including the risks, benefits and alternatives for the proposed anesthesia with the patient or authorized representative who has indicated his/her understanding and acceptance.   Dental advisory given  Plan Discussed with: CRNA  Anesthesia Plan Comments: (Discussed MAC and general. Higher risk with lung cancer and right pleural effusion.)        Anesthesia Quick Evaluation

## 2014-10-31 NOTE — H&P (Signed)
Reason for Consult: Biliary obstruction   Referring Physician(s): GI  History of Present Illness: Kristina Lawson is a 68 y.o. female with metastatic lung cancer, now with painless jaundice and hyperbilirubinemia. CT revealed diffuse biliary ductal dilatation. She is s/p ERCP today that was unsuccessful crossing the narrowing. IR received request for image guided biliary wire placement and possible biliary drain if unable to cross wire. The patient and her grandson are present during today's discussion and would like to proceed with attempt of percutaneous access. She denies any abdominal pain, chest pain, shortness of breath or palpitations. The patient denies any history of sleep apnea or chronic oxygen use. She has previously tolerated sedation without complications.    Past Medical History  Diagnosis Date  . Hypertension   . Smoker   . Hyperlipidemia   . Brain cancer 06/17/14    multiple brain metastses  . 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 Seizures 06/17/14    witness at home brought to the ED  . S/P radiation therapy 07/16/14-07/31/14/palliative    brain,left base&infratemporal .chest/meiastinum,proxiaml rt humerus    Past Surgical History  Procedure Laterality Date  . Ct biopsy  06/19/14    Left adrenal mass    Allergies: Penicillins  Medications: Prior to Admission medications   Medication Sig Start Date End Date Taking? Authorizing Provider  bisoprolol (ZEBETA) 5 MG tablet Take 1 tablet (5 mg total) by mouth daily. 07/23/14  Yes Susy Frizzle, MD  Cholecalciferol (VITAMIN D) 2000 UNITS tablet Take 2,000 Units by mouth daily.   Yes Historical Provider, MD  hyaluronate sodium (RADIAPLEXRX) GEL Apply 1 application topically 2 (two) times daily. Apply to chest,shoulder,& brain areas where radiation is treated daily  And bedtime 07/17/14  Yes Jodelle Gross, MD  HYDROcodone-acetaminophen (NORCO/VICODIN) 5-325 MG per tablet 1 tablet every  6 (six) hours as needed for moderate pain or severe pain.  07/11/14  Yes Historical Provider, MD  metoCLOPramide (REGLAN) 10 MG tablet Take 1 tablet (10 mg total) by mouth 4 (four) times daily. Patient taking differently: Take 10 mg by mouth daily.  10/09/14  Yes Jodelle Gross, MD  nystatin (MYCOSTATIN) 100000 UNIT/ML suspension Take 5 mLs (500,000 Units total) by mouth 4 (four) times daily. Patient taking differently: Take 5 mLs by mouth daily.  10/09/14  Yes Jodelle Gross, MD  Omega-3 Fatty Acids (FISH OIL) 1200 MG CAPS Take 2,400 mg by mouth daily.    Yes Historical Provider, MD  levETIRAcetam (KEPPRA) 500 MG tablet Take 1 tablet (500 mg total) by mouth 2 (two) times daily. Patient not taking: Reported on 10/09/2014 08/19/14   Carlton Adam, PA-C  levETIRAcetam (KEPPRA) 500 MG tablet Take 500 mg by mouth.    Historical Provider, MD  LORazepam (ATIVAN) 0.5 MG tablet Take 1 tablet (0.5 mg total) by mouth once as needed for anxiety. Take 30 min prior to treatment for anxiety. Patient not taking: Reported on 10/09/2014 06/28/14   Jodelle Gross, MD  prochlorperazine (COMPAZINE) 10 MG tablet Take 1 tablet (10 mg total) by mouth every 6 (six) hours as needed for nausea or vomiting. 10/25/14   Jodelle Gross, MD    Family History  Problem Relation Age of Onset  . Heart disease Mother     History   Social History  . Marital Status: Single    Spouse Name: N/A    Number of Children: N/A  . Years of  Education: N/A   Occupational History  . retired    Social History Main Topics  . Smoking status: Former Smoker -- 1.00 packs/day for 45 years    Types: Cigarettes    Quit date: 06/29/2014  . Smokeless tobacco: Never Used  . Alcohol Use: Yes     Comment: Occasional Wine  . Drug Use: No  . Sexual Activity: None   Other Topics Concern  . None   Social History Narrative    Review of Systems: A 12 point ROS discussed and pertinent positives are indicated in the HPI above.  All other systems are  negative.  Review of Systems  Vital Signs: BP 184/90 mmHg  Pulse 104  Temp(Src) 97.8 F (36.6 C) (Oral)  Resp 18  Ht 5\' 3"  (1.6 m)  Wt 139 lb 3.2 oz (63.141 kg)  BMI 24.66 kg/m2  SpO2 97%  Physical Exam General: Awake, NAD, jaundice Heart: Tachycardic without M/G/R Lungs: CTA left, diminished right base Abd: Soft, NT, (+) BS  Imaging: Ct Soft Tissue Neck W Contrast  10/11/2014   CLINICAL DATA:  Lung cancer.  LEFT-sided neck swelling for 2 weeks.  EXAM: CT NECK WITH CONTRAST  TECHNIQUE: Multidetector CT imaging of the neck was performed using the standard protocol following the bolus administration of intravenous contrast.  CONTRAST:  174mL OMNIPAQUE IOHEXOL 300 MG/ML  SOLN  COMPARISON:  MR brain 06/15/2014.  PET scan 07/03/2014.  FINDINGS: BILATERAL centrally necrotic level 2, level 3, ground level 4 lymph nodes are seen in the neck with mass effect on the LEFT internal jugular vein without visible thrombus. As seen on image 34 series 2, a retromandibular node measures 38 x 23 mm cross-section. As seen on image 78 series 2, conglomerate RIGHT level 4 nodal mass measures 31 x 29 mm.  10 x 14 mm RIGHT thyroid nodule, nonspecific. This was mildly hypermetabolic on prior PET scan but appears roughly stable. No airway compromise. RIGHT pleural effusion. Mediastinal adenopathy. Vascular calcification.  Intracranial metastatic disease difficult to visualize on this exam although osseous spread to the LEFT temporalis region appears improved.  IMPRESSION: Worsening metastatic disease to the cervical lymph nodes as described. These have significantly progressed from prior PET scan.   Electronically Signed   By: Rolla Flatten M.D.   On: 10/11/2014 15:13   Ct Abdomen Pelvis W Contrast  10/30/2014   CLINICAL DATA:  Metastatic lung carcinoma. Painless jaundice. Elevated liver function tests.  EXAM: CT ABDOMEN AND PELVIS WITH CONTRAST  TECHNIQUE: Multidetector CT imaging of the abdomen and pelvis was  performed using the standard protocol following bolus administration of intravenous contrast.  CONTRAST:  166mL OMNIPAQUE IOHEXOL 300 MG/ML SOLN, 1 OMNIPAQUE IOHEXOL 300 MG/ML SOLN  COMPARISON:  06/18/2014  FINDINGS: Lower Chest: New right pleural effusion and right lower lung atelectasis. Incomplete visualization of mediastinal lymphadenopathy. New right lateral chest wall soft tissue mass measuring 3.4 x 6.1 cm on image 9. Increased pulmonary nodules seen in the lateral left lung base measuring up to 13 mm, consistent with pulmonary metastases.  Hepatobiliary: New diffuse liver metastases, with index lesion in the peripheral right hepatic lobe measuring 3.8 cm on image 25. New diffuse biliary ductal dilatation is also seen due to lymphadenopathy in the porta hepatis and peripancreatic region.  Pancreas: Multiple new soft tissue masses are seen involving the pancreas diffusely which may be due to peripancreatic lymphadenopathy or pancreatic metastases. Pancreatic ductal dilatation noted in the pancreatic body.  Spleen: Several new small low-attenuation splenic lesions  are seen, consistent with splenic metastases.  Adrenal Glands: Left adrenal mass measures 3.2 x 3.0 cm compared to 3.2 x 3.4 cm on previous study.  Kidneys/Urinary Tract: Horseshoe kidney again noted. No evidence of renal masses or hydronephrosis.  Stomach/Bowel/Peritoneum: No evidence of bowel obstruction. Mild ascites is seen. Two solid masses are seen within the omental fat measuring 2.0 cm on image 58 and 2.8 cm on image 74.  Vascular/Lymphatic: New bulky lymphadenopathy in the porta hepatis, peripancreatic regions, abdominal retroperitoneum, and small bowel mesentery.  Reproductive: New large bilateral adnexal cystic and solid masses are seen measuring 6.9 x 8.6 cm on the right and 6.4 x 8.9 cm on the left. These are consistent with ovarian metastases.  Other:  None.  Musculoskeletal:  No suspicious bone lesions identified.  IMPRESSION: Marked  progression of widespread metastatic disease involving the liver, spleen, pancreas, upper abdominal and retroperitoneal lymph nodes, mesentery, omentum, and bilateral ovaries.  Diffuse biliary ductal dilatation due to lymphadenopathy in the porta hepatis and pancreas.  New large right lateral chest wall metastasis and small left lower lobe pulmonary metastases. New large right pleural effusion.   Electronically Signed   By: Earle Gell M.D.   On: 10/30/2014 10:08   Dg Chest Port 1 View  10/30/2014   CLINICAL DATA:  Follow-up pleural effusion, history of metastatic lung cancer  EXAM: PORTABLE CHEST - 1 VIEW  COMPARISON:  None.  FINDINGS: Large right pleural effusion. No left pleural effusion. Mild interstitial thickening. No pneumothorax. Stable cardiomediastinal silhouette. No acute osseous abnormality.  IMPRESSION: Large right pleural effusion.   Electronically Signed   By: Kathreen Devoid   On: 10/30/2014 17:42    Labs:  CBC:  Recent Labs  07/05/14 0918 10/25/14 1515 10/29/14 1753 10/30/14 0449  WBC 14.4* 9.3 8.6 7.8  HGB 14.4 11.4* 10.4* 9.2*  HCT 42.9 33.4* 30.5* 27.2*  PLT 299 371 274 328    COAGS:  Recent Labs  06/18/14 1620 07/05/14 0918 10/29/14 1753  INR 1.20 1.04 1.18  APTT 36 28  --     BMP:  Recent Labs  06/19/14 0339 10/11/14 1614 10/25/14 1515 10/29/14 1753 10/30/14 0449  NA 136*  --  131* 130* 130*  K 4.0  --  3.6* 3.6 3.7  CL 98  --  92* 99 99  CO2 23  --  24 22 24   GLUCOSE 112*  --  133* 99 104*  BUN 12 8.2 12 12 13   CALCIUM 9.1  --  9.6 8.8 8.3*  CREATININE 0.72 0.8 0.65 0.38* 0.44*  GFRNONAA 88*  --  >90 >90 >90  GFRAA >90  --  >90 >90 >90    LIVER FUNCTION TESTS:  Recent Labs  06/18/14 0253 10/25/14 1515 10/29/14 1753 10/30/14 0449  BILITOT 0.6 15.2* 15.6* 14.6*  AST 18 140* 136* 124*  ALT 13 181* 154* 139*  ALKPHOS 72 534* 488* 450*  PROT 7.0 6.4 5.9* 5.3*  ALBUMIN 3.5 2.6* 2.4* 2.2*    Assessment and Plan: Stage IV lung  cancer Jaundice Biliary obstruction secondary to malignant tumor, T Bili 14.6 S/p ERCP unable to cross narrowing  Request for image guided cholangiogram with wire placement across narrowing and patient to return to endoscopy for internalization of stent. If unable to cross wire will leave biliary drain in place. Patient has been NPO and received sedation today, no blood thinners, labs reviewed. Risks and Benefits discussed with the patient and her family. All questions were answered, patient and  her grandson are agreeable to proceed. Consent signed and in chart.   Thank you for this interesting consult.  I greatly enjoyed meeting Sylva Overley and look forward to participating in their care.    I spent a total of 20 minutes face to face in clinical consultation, greater than 50% of which was counseling/coordinating care for   Signed: Hedy Jacob 10/31/2014, 12:52 PM

## 2014-10-31 NOTE — Progress Notes (Signed)
Unable to do ERCP because of invasive tumor obscuring the papilla which could not be identified. Plan to proceed with PTCA today.  If a wire can be passed to the area of obstruction oral do a "rendezvous procedure" whereby an internal biliary stent will be placed.  Failing that, she will be left with a temporary external drain and reattempt passage of the internal guidewire in 2-3 days.

## 2014-10-31 NOTE — Transfer of Care (Signed)
Immediate Anesthesia Transfer of Care Note  Patient: Kristina Lawson  Procedure(s) Performed: Procedure(s): ENDOSCOPIC RETROGRADE CHOLANGIOPANCREATOGRAPHY (ERCP) WITH PROPOFOL (N/A)  Patient Location: PACU  Anesthesia Type:MAC  Level of Consciousness: awake, alert  and oriented  Airway & Oxygen Therapy: Patient Spontanous Breathing and Patient connected to nasal cannula oxygen  Post-op Assessment: Report given to PACU RN and Post -op Vital signs reviewed and stable  Post vital signs: Reviewed and stable  Complications: No apparent anesthesia complications

## 2014-10-31 NOTE — Progress Notes (Signed)
Sheath from left PTC removed and pressure held for 10 min per MD. Dressing placed ,aera clean and dry.

## 2014-10-31 NOTE — H&P (View-Only) (Signed)
     [facility] Progress Note  Subjective:  Feels the same.  Has nausea but no significant abdominal pain.  No fevers.  Objective:  Vital signs in last 24 hours: Temp:  [97.4 F (36.3 C)-98.1 F (36.7 C)] 98.1 F (36.7 C) (12/23 0514) Pulse Rate:  [88] 88 (12/23 0514) Resp:  [16-18] 16 (12/23 0514) BP: (134-145)/(69-72) 136/69 mmHg (12/23 0514) SpO2:  [96 %-100 %] 100 % (12/23 0514) Weight:  [139 lb 3.2 oz (63.141 kg)] 139 lb 3.2 oz (63.141 kg) (12/22 1630) Last BM Date: 10/29/14 General:  Alert, Well-developed, in NAD; jaundice noted. Heart:  Regular rate and rhythm; no murmurs Pulm:  CTAB.  No W/R/R. Abdomen:  Soft, non-distended. BS present.  Minimal epigastric TTP without R/R/G.   Extremities:  Without edema. Neurologic:  Alert and  oriented x4;  grossly normal neurologically. Psych:  Alert and cooperative. Normal mood and affect.  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 Probably new metastatic disease in the liver.  LFT's newly elevated with total bili of 14.6 today.   -Monitor LFT's. -Awaiting CT scan of the abdomen and pelvis with contrast. -Will likely need ERCP with palliative stent for decompression, which we tentatively have planned for 12/24 at 10:30 am, pending results of CT scan.  Lovenox to be held.   LOS: 1 day   ZEHR, JESSICA D.   10/30/2014, 9:05 AM  Pager number [phone]  Pt seen and examined, x-rays were reviewed.  Ct demonstrates extensive infiltrative liver disease from mets, and enlarged nodes at the porta causing bile duct obstruction.  The latter can be palliated with biliary stenting.  ERCP scheduled for am.  If stable can be d/ced after procedure.

## 2014-10-31 NOTE — Op Note (Signed)
[facility] [place], [address]   ERCP PROCEDURE REPORT        EXAM DATE: 2014-11-08  PATIENT NAME:          Kristina Lawson, Kristina Lawson          MR #: [id number] BIRTHDATE:       [birth year]     VISIT #:     [id number] ATTENDING:     Inda Castle, MD     STATUS:     inpatient ASSISTANT:      Sharon Mt and Shaune Spittle  INDICATIONS:  The patient is a 68 yr old female here for an ERCP due to bile duct stricture. PROCEDURE PERFORMED:     ERCP, diagnostic MEDICATIONS:     Monitored anesthesia care and Cipro 400 mg IV   CONSENT: The patient understands the risks and benefits of the procedure and understands that these risks include, but are not limited to: sedation, allergic reaction, infection, perforation and/or bleeding. Alternative means of evaluation and treatment include, among others: physical exam, x-rays, and/or surgical intervention. The patient elects to proceed with this endoscopic procedure.  DESCRIPTION OF PROCEDURE: During intra-op preparation period all mechanical & medical equipment was checked for proper function. Hand hygiene and appropriate measures for infection prevention was taken. After the risks, benefits and alternatives of the procedure were thoroughly explained, Informed was verified, confirmed and timeout was successfully executed by the treatment team. With the patient in left semi-prone position, medications were administered intravenously.The    was passed from the mouth into the esophagus and further advanced from the esophagus into the stomach. From stomach scope was directed to the third portion of the duodenum. Major papilla was aligned with the duodenoscope. The scope position was confirmed fluoroscopically. Rest of the findings/therapeutics are given below. The scope was then completely withdrawn from the patient and the procedure completed. The pulse, BP, and O2 saturation were monitored and documented by  the physician and the nursing staff throughout the entire procedure. The patient was cared for as planned according to standard protocol. The patient was then discharged to recovery in stable condition and with appropriate post procedure care.  There was narrowing of the second portion of the duodenum secondary to infiltrating tumor.  There was friable mucosa throughout the second portion of the duodenum.  This encompassed the medial wall and extended several centimeters.  The scope could not be passed through this area.  I was unable to identify the papilla.  The wire was passed blindly, under fluoroscopic guidance.  At one point it appeared to be in the duct and several injections were made.  There seemed to be extravasation into the extraluminal space and the wire was withdrawn.    ADVERSE EVENT: IMPRESSIONS:     Th   Infiltrating tumor partially obstructing the duodenum and obscuring the papilla.  Bile duct not cannulated.   RECOMMENDATIONS:     PTC.  A wire can be successfully passed through the area of obstruction will do a "rendezvous procedure" and place an internal biliary stent REPEAT EXAM:   ___________________________________ Inda Castle, MD eSigned:  Inda Castle, MD 2014/11/08 12:46 PM   cc:  CPT CODES: ICD9 CODES:  The ICD and CPT codes recommended by this software are interpretations from the data that the clinical staff has captured with the software.  The verification of the translation of this report to the ICD and CPT codes and modifiers is the sole responsibility of  the health care institution and practicing physician where this report was generated.  [employer] will not be held responsible for the validity of the ICD and CPT codes included on this report.  AMA assumes no liability for data contained or not contained herein. CPT is a Designer, television/film set of the [employer].

## 2014-10-31 NOTE — Op Note (Signed)
[facility] [place], [address]   ENDOSCOPY PROCEDURE REPORT  PATIENT: Kristina, Lawson  MR#: [id number] BIRTHDATE: 26-Sep-1946 , 44  yrs. old GENDER: female ENDOSCOPIST: Inda Castle, MD REFERRED BY:  Zola Button, M.D. PROCEDURE DATE:  10/31/2014 PROCEDURE:  EGD w/ transendoscopic stent ASA CLASS:     Class III INDICATIONS:  Bile duct obstruction. MEDICATIONS: Monitored anesthesia care TOPICAL ANESTHETIC:  DESCRIPTION OF PROCEDURE: After the risks benefits and alternatives of the procedure were thoroughly explained, informed consent was obtained.  The Pentax Gastroscope [id number] endoscope was introduced through the mouth and advanced to the third portion of the duodenum , Without limitations.  The instrument was slowly withdrawn as the mucosa was fully examined.    Again noted was a friable mass encompassing the medial wall of the duodenum.  benign millimeter gastroscope was passed beyond this area with moderate resistance. A previously placed PTC wire was located in the third portion of the duodenum.  This was grabbed by a polypectomy snare and pulled out through the mouth.  A duodenal scope was placed over the wire and through the stomach into the second portion the duodenum.  Over the guidewire a 10 French 8 cm uncovered metal biliary stent was placed through the distal bile duct stricture that was defined by prior PTC.  Prompt flow of black bile was noted.   Except for the findings listed the EGD was otherwise normal.         The scope was then withdrawn from the patient and the procedure completed.  COMPLICATIONS: There were no immediate complications.  ENDOSCOPIC IMPRESSION: 1.  metastatic lung cancer to the duodenum causing partial obstruction 2.  Status post placement of a metal uncovered biliary stent  RECOMMENDATIONS: continue antibiotics Advance diet  REPEAT EXAM:  eSigned:  Inda Castle, MD 10/31/2014 3:53  PM    CC:

## 2014-11-01 ENCOUNTER — Inpatient Hospital Stay (HOSPITAL_COMMUNITY): Payer: Medicare PPO

## 2014-11-01 DIAGNOSIS — D638 Anemia in other chronic diseases classified elsewhere: Secondary | ICD-10-CM

## 2014-11-01 LAB — CBC
HCT: 28.5 % — ABNORMAL LOW (ref 36.0–46.0)
HEMOGLOBIN: 9.3 g/dL — AB (ref 12.0–15.0)
MCH: 28.5 pg (ref 26.0–34.0)
MCHC: 32.6 g/dL (ref 30.0–36.0)
MCV: 87.4 fL (ref 78.0–100.0)
Platelets: 314 10*3/uL (ref 150–400)
RBC: 3.26 MIL/uL — AB (ref 3.87–5.11)
RDW: 19.2 % — ABNORMAL HIGH (ref 11.5–15.5)
WBC: 10.4 10*3/uL (ref 4.0–10.5)

## 2014-11-01 LAB — BASIC METABOLIC PANEL
ANION GAP: 10 (ref 5–15)
BUN: 11 mg/dL (ref 6–23)
CHLORIDE: 100 meq/L (ref 96–112)
CO2: 21 mmol/L (ref 19–32)
CREATININE: 0.39 mg/dL — AB (ref 0.50–1.10)
Calcium: 8 mg/dL — ABNORMAL LOW (ref 8.4–10.5)
GFR calc non Af Amer: >90 mL/min (ref >90)
Glucose, Bld: 132 mg/dL — ABNORMAL HIGH (ref 70–99)
POTASSIUM: 3.3 mmol/L — AB (ref 3.5–5.1)
SODIUM: 131 mmol/L — AB (ref 135–145)

## 2014-11-01 MED ORDER — POTASSIUM CHLORIDE CRYS ER 20 MEQ PO TBCR
40.0000 meq | EXTENDED_RELEASE_TABLET | Freq: Once | ORAL | Status: AC
  Administered 2014-11-01: 40 meq via ORAL
  Filled 2014-11-01: qty 2

## 2014-11-01 NOTE — Discharge Instructions (Signed)
Thoracentesis A thoracentesis procedure is done to remove fluid that has built up in the space between your chest wall and your lungs (pleural space). Although you always have a small amount of fluid in the pleural space, some medical conditions, such as heart failure, can create too much fluid. This extra fluid is removed using a needle inserted through the skin and tissue and into the pleural space.  A thoracentesis may be done to:  Understand why there is extra fluid and create the treatment plan that is right for you.  Help get rid of shortness of breath, discomfort, or pain caused by the extra fluid. LET [facility] CARE PROVIDER KNOW ABOUT: 1. Any allergies you have. 2. All medicines you are taking, including vitamins, herbs, eye drops, creams, and over-the-counter medicines. Be sure to mention any use of steroids, either by mouth or cream. 3. Previous problems you or members of your family have had with the use of anesthetics. 4. Possibility of pregnancy, if this applies. 5. Any blood disorders you have, including a history of blood clots. 6. Previous surgeries you have had. 7. Medical conditions you have. RISKS AND COMPLICATIONS Generally, this is a safe procedure. However, problems can occur and include:  1. Injury to the lung. 2. Possible infection. 3. Possible collapse of a lung. BEFORE THE PROCEDURE 1. Ask your health care provider if you need to arrive early before your procedure. 2. Inform your health care provider if you have had a frequent cough. If you have had frequent coughing episodes, your health care provider may want you to take a cough suppressant. 3. You may have a chest X-ray to determine the location and amount of fluid in the pleural space. 4. Ask your health care provider about: 1. Changing or stopping your regular medicines. This is especially important if you are taking diabetes medicines or blood thinners. 2. Taking medicines such as aspirin and ibuprofen.  These medicines can thin your blood. Do not take these medicines before your procedure if your health care provider asks you not to. PROCEDURE 1. You may be asked to sit upright and lean slightly forward for the procedure. 2. An area of your back will be cleaned and disinfected. 3. A numbing medicine (local anesthetic) may then be injected into the skin and tissue. 4. A needle will be inserted between your ribs and advanced into the pleural space. You may feel pressure or slight pain as the needle is positioned into the pleural space. 5. Fluid will be removed from the pleural space through the needle. You may feel pressure as the fluid is removed. 6. The needle will be withdrawn once the excess fluid has been removed. A sample of the fluid may be sent for examination. 7. The puncture site may be covered with a bandage (dressing). AFTER THE PROCEDURE  A chest X-ray may be done.  Your recovery will be assessed and monitored. If there are no problems, you should be able to go home shortly after the procedure.  Ask your health care provider when your test results will be ready. Make sure to get your test results.  Follow your health care provider's instructions on dressing changes and removal. [place] may resume your normal diet and activities as directed by your health care provider.  Take medicines only as directed by your health care provider. SEEK MEDICAL CARE IF:   You have drainage, redness, swelling, or pain at your puncture site.  You have a fever.  SEEK IMMEDIATE MEDICAL CARE IF:  You have shortness of breath or chest pain.  Document Released: 05/10/2005 Document Revised: 03/11/2014 Document Reviewed: 08/22/2008 [facility] Patient Information 2015 [place], [place]. This information is not intended to replace advice given to you by your health care provider. Make sure you discuss any questions you have with your health care provider.

## 2014-11-01 NOTE — Progress Notes (Signed)
Progress Note   Subjective  *Looks and feels well.  Denies abdominal pain.  Eating**   Objective  Vital signs in last 24 hours: Temp:  [97.2 F (36.2 C)-97.9 F (36.6 C)] 97.9 F (36.6 C) (12/25 0612) Pulse Rate:  [95-119] 110 (12/25 0612) Resp:  [8-27] 18 (12/25 0612) BP: (128-219)/(64-116) 146/64 mmHg (12/25 0612) SpO2:  [90 %-100 %] 93 % (12/25 0612) Last BM Date: 10/29/14 General:   Alert,  Well-developed,   in NAD Heart:  Regular rate and rhythm; no murmurs Abdomen:  Soft, nontender and nondistended. Normal bowel sounds, without guarding, and without rebound.   Extremities:  Without edema. Neurologic:  Alert and  oriented x4;  grossly normal neurologically. Psych:  Alert and cooperative. Normal mood and affect.  Intake/Output from previous day: 12/24 0701 - 12/25 0700 In: 3301 [P.O.:360; I.V.:2640; IV Piggyback:301] Out: -  Intake/Output this shift:    Lab Results:  Recent Labs  10/29/14 1753 10/30/14 0449 11/01/14 0434  WBC 8.6 7.8 10.4  HGB 10.4* 9.2* 9.3*  HCT 30.5* 27.2* 28.5*  PLT 274 328 314   BMET  Recent Labs  10/29/14 1753 10/30/14 0449 11/01/14 0434  NA 130* 130* 131*  K 3.6 3.7 3.3*  CL 99 99 100  CO2 _0 GLUCOSE 99 104* 132*  BUN _1 CREATININE 0.38* 0.44* 0.39*  CALCIUM 8.8 8.3* 8.0*   LFT  Recent Labs  10/30/14 0449  PROT 5.3*  ALBUMIN 2.2*  AST 124*  ALT 139*  ALKPHOS 450*  BILITOT 14.6*   PT/INR  Recent Labs  10/29/14 1753  LABPROT 15.2  INR 1.18   Hepatitis Panel No results for input(s): HEPBSAG, HCVAB, HEPAIGM, HEPBIGM in the last 72 hours.  Studies/Results: Ct Abdomen Pelvis W Contrast  10/30/2014   CLINICAL DATA:  Metastatic lung carcinoma. Painless jaundice. Elevated liver function tests.  EXAM: CT ABDOMEN AND PELVIS WITH CONTRAST  TECHNIQUE: Multidetector CT imaging of the abdomen and pelvis was performed using the standard protocol following bolus administration of intravenous contrast.   CONTRAST:  151m OMNIPAQUE IOHEXOL 300 MG/ML SOLN, 1 OMNIPAQUE IOHEXOL 300 MG/ML SOLN  COMPARISON:  06/18/2014  FINDINGS: Lower Chest: New right pleural effusion and right lower lung atelectasis. Incomplete visualization of mediastinal lymphadenopathy. New right lateral chest wall soft tissue mass measuring 3.4 x 6.1 cm on image 9. Increased pulmonary nodules seen in the lateral left lung base measuring up to 13 mm, consistent with pulmonary metastases.  Hepatobiliary: New diffuse liver metastases, with index lesion in the peripheral right hepatic lobe measuring 3.8 cm on image 25. New diffuse biliary ductal dilatation is also seen due to lymphadenopathy in the porta hepatis and peripancreatic region.  Pancreas: Multiple new soft tissue masses are seen involving the pancreas diffusely which may be due to peripancreatic lymphadenopathy or pancreatic metastases. Pancreatic ductal dilatation noted in the pancreatic body.  Spleen: Several new small low-attenuation splenic lesions are seen, consistent with splenic metastases.  Adrenal Glands: Left adrenal mass measures 3.2 x 3.0 cm compared to 3.2 x 3.4 cm on previous study.  Kidneys/Urinary Tract: Horseshoe kidney again noted. No evidence of renal masses or hydronephrosis.  Stomach/Bowel/Peritoneum: No evidence of bowel obstruction. Mild ascites is seen. Two solid masses are seen within the omental fat measuring 2.0 cm on image 58 and 2.8 cm on image 74.  Vascular/Lymphatic: New bulky lymphadenopathy in the porta hepatis, peripancreatic regions, abdominal retroperitoneum, and small bowel mesentery.  Reproductive: New large bilateral  adnexal cystic and solid masses are seen measuring 6.9 x 8.6 cm on the right and 6.4 x 8.9 cm on the left. These are consistent with ovarian metastases.  Other:  None.  Musculoskeletal:  No suspicious bone lesions identified.  IMPRESSION: Marked progression of widespread metastatic disease involving the liver, spleen, pancreas, upper  abdominal and retroperitoneal lymph nodes, mesentery, omentum, and bilateral ovaries.  Diffuse biliary ductal dilatation due to lymphadenopathy in the porta hepatis and pancreas.  New large right lateral chest wall metastasis and small left lower lobe pulmonary metastases. New large right pleural effusion.   Electronically Signed   By: Earle Gell M.D.   On: 10/30/2014 10:08   Ir Biliary Dilitation  10/31/2014   CLINICAL DATA:  68 year old female with a malignant obstructed jaundice and profound hyperbilirubinemia. Patient underwent unsuccessful ERCP earlier today. Percutaneous transhepatic cholangiogram is warranted. We will attempt to cross the lesion with a wire and return the patient to endoscopy for rendezvous stent placement. This would be done to facilitate the patient's early discharged tomorrow given the Christmas holiday. If the lesion cannot be crossed with relative ease, then a percutaneous biliary drain will be left to decompress the system and stenting can be performed percutaneously at a later date.  EXAM: PERCUTANEOUS TRANSHEPATIC CHOLANGIOGRAM; BILIARY DILATION; IR ULTRASOUND GUIDANCE  Date: 10/31/2014  PROCEDURE: 1. Ultrasound-guided percutaneous transhepatic access of the biliary tree 2. Percutaneous transhepatic cholangiogram 3. Internalization of a 4 French catheter through the high-grade distal common bile duct stenosis and into the duodenum 4. Placement of a super stiff glidewire into the proximal small bowel Interventional Radiologist:  Criselda Peaches, MD  ANESTHESIA/SEDATION: Moderate (conscious) sedation was used. Two mg Versed, 100 mcg Fentanyl were administered intravenously. The patient's vital signs were monitored continuously by radiology nursing throughout the procedure.  Sedation Time: 50 minutes  MEDICATIONS: 400 mg ciprofloxacin administered endoscopy earlier today.  FLUOROSCOPY TIME:  11 minutes 30 seconds  487 mGy  CONTRAST:  30 mL Omnipaque 300  TECHNIQUE: Informed  consent was obtained from the patient following explanation of the procedure, risks, benefits and alternatives. The patient understands, agrees and consents for the procedure. All questions were addressed. A time out was performed.  Maximal barrier sterile technique utilized including caps, mask, sterile gowns, sterile gloves, large sterile drape, hand hygiene, and Betadine skin prep.  The mid epigastric region was interrogated with ultrasound. A suitable segment 2 bile duct was selected. Local anesthesia was attained by infiltration with 1% lidocaine. Under real-time sonographic guidance, the bile duct was punctured with a 21 gauge Accustick needle. A wire was advanced into the central bile ducts. The 4 [place] Accustick sheath was then advanced over the wire and into the liver.  A percutaneous transhepatic cholangiogram was performed. There is marked dilatation of the intra and extrahepatic biliary tree. The common bile duct measures up to 15 mm in diameter. There is complete occlusion of the common bile duct distally.  Using a 4 French angled glide catheter and glidewire, the glidewire was advanced into the common bile duct. The wire and catheter were used to dilate the obstruction in the 4 French catheter was successfully passed into the duodenum. A super stiff glidewire was then advanced into the proximal small bowel. The glidewire and Accustick sheath were left in place and secured to the skin. The patient tolerated the procedure well.  COMPLICATIONS: None.  IMPRESSION: 1. Percutaneous transhepatic cholangiogram demonstrates high-grade distal common bile duct obstruction and dilatation of the intra and extrahepatic  biliary tree. 2. Successful internalization and dilatation of the distal common bile duct stricture to 4 [place] using a 4 [place] glide catheter and wire combination. 3. 270 cm super stiff glidewire left across of the stenosis and into the proximal small bowel for planned rendezvous stenting. Signed,   Criselda Peaches, MD  [facility]  [facility]   Electronically Signed   By: Jacqulynn Cadet M.D.   On: 10/31/2014 14:29   Ir Ptc  10/31/2014   CLINICAL DATA:  68 year old female with a malignant obstructed jaundice and profound hyperbilirubinemia. Patient underwent unsuccessful ERCP earlier today. Percutaneous transhepatic cholangiogram is warranted. We will attempt to cross the lesion with a wire and return the patient to endoscopy for rendezvous stent placement. This would be done to facilitate the patient's early discharged tomorrow given the Christmas holiday. If the lesion cannot be crossed with relative ease, then a percutaneous biliary drain will be left to decompress the system and stenting can be performed percutaneously at a later date.  EXAM: PERCUTANEOUS TRANSHEPATIC CHOLANGIOGRAM; BILIARY DILATION; IR ULTRASOUND GUIDANCE  Date: 10/31/2014  PROCEDURE: 1. Ultrasound-guided percutaneous transhepatic access of the biliary tree 2. Percutaneous transhepatic cholangiogram 3. Internalization of a 4 French catheter through the high-grade distal common bile duct stenosis and into the duodenum 4. Placement of a super stiff glidewire into the proximal small bowel Interventional Radiologist:  Criselda Peaches, MD  ANESTHESIA/SEDATION: Moderate (conscious) sedation was used. Two mg Versed, 100 mcg Fentanyl were administered intravenously. The patient's vital signs were monitored continuously by radiology nursing throughout the procedure.  Sedation Time: 50 minutes  MEDICATIONS: 400 mg ciprofloxacin administered endoscopy earlier today.  FLUOROSCOPY TIME:  11 minutes 30 seconds  487 mGy  CONTRAST:  30 mL Omnipaque 300  TECHNIQUE: Informed consent was obtained from the patient following explanation of the procedure, risks, benefits and alternatives. The patient understands, agrees and consents for the procedure. All questions were addressed. A time out was  performed.  Maximal barrier sterile technique utilized including caps, mask, sterile gowns, sterile gloves, large sterile drape, hand hygiene, and Betadine skin prep.  The mid epigastric region was interrogated with ultrasound. A suitable segment 2 bile duct was selected. Local anesthesia was attained by infiltration with 1% lidocaine. Under real-time sonographic guidance, the bile duct was punctured with a 21 gauge Accustick needle. A wire was advanced into the central bile ducts. The 4 [place] Accustick sheath was then advanced over the wire and into the liver.  A percutaneous transhepatic cholangiogram was performed. There is marked dilatation of the intra and extrahepatic biliary tree. The common bile duct measures up to 15 mm in diameter. There is complete occlusion of the common bile duct distally.  Using a 4 French angled glide catheter and glidewire, the glidewire was advanced into the common bile duct. The wire and catheter were used to dilate the obstruction in the 4 French catheter was successfully passed into the duodenum. A super stiff glidewire was then advanced into the proximal small bowel. The glidewire and Accustick sheath were left in place and secured to the skin. The patient tolerated the procedure well.  COMPLICATIONS: None.  IMPRESSION: 1. Percutaneous transhepatic cholangiogram demonstrates high-grade distal common bile duct obstruction and dilatation of the intra and extrahepatic biliary tree. 2. Successful internalization and dilatation of the distal common bile duct stricture to 4 [place] using a 4 [place] glide catheter and wire combination. 3. 270 cm super stiff glidewire left across of the stenosis and  into the proximal small bowel for planned rendezvous stenting. Signed,  Criselda Peaches, MD  [facility]  [facility]   Electronically Signed   By: Jacqulynn Cadet M.D.   On: 10/31/2014 14:29   Ir US Guide Bx Asp/drain  10/31/2014    CLINICAL DATA:  68 year old female with a malignant obstructed jaundice and profound hyperbilirubinemia. Patient underwent unsuccessful ERCP earlier today. Percutaneous transhepatic cholangiogram is warranted. We will attempt to cross the lesion with a wire and return the patient to endoscopy for rendezvous stent placement. This would be done to facilitate the patient's early discharged tomorrow given the Christmas holiday. If the lesion cannot be crossed with relative ease, then a percutaneous biliary drain will be left to decompress the system and stenting can be performed percutaneously at a later date.  EXAM: PERCUTANEOUS TRANSHEPATIC CHOLANGIOGRAM; BILIARY DILATION; IR ULTRASOUND GUIDANCE  Date: 10/31/2014  PROCEDURE: 1. Ultrasound-guided percutaneous transhepatic access of the biliary tree 2. Percutaneous transhepatic cholangiogram 3. Internalization of a 4 French catheter through the high-grade distal common bile duct stenosis and into the duodenum 4. Placement of a super stiff glidewire into the proximal small bowel Interventional Radiologist:  Criselda Peaches, MD  ANESTHESIA/SEDATION: Moderate (conscious) sedation was used. Two mg Versed, 100 mcg Fentanyl were administered intravenously. The patient's vital signs were monitored continuously by radiology nursing throughout the procedure.  Sedation Time: 50 minutes  MEDICATIONS: 400 mg ciprofloxacin administered endoscopy earlier today.  FLUOROSCOPY TIME:  11 minutes 30 seconds  487 mGy  CONTRAST:  30 mL Omnipaque 300  TECHNIQUE: Informed consent was obtained from the patient following explanation of the procedure, risks, benefits and alternatives. The patient understands, agrees and consents for the procedure. All questions were addressed. A time out was performed.  Maximal barrier sterile technique utilized including caps, mask, sterile gowns, sterile gloves, large sterile drape, hand hygiene, and Betadine skin prep.  The mid epigastric region was  interrogated with ultrasound. A suitable segment 2 bile duct was selected. Local anesthesia was attained by infiltration with 1% lidocaine. Under real-time sonographic guidance, the bile duct was punctured with a 21 gauge Accustick needle. A wire was advanced into the central bile ducts. The 4 [place] Accustick sheath was then advanced over the wire and into the liver.  A percutaneous transhepatic cholangiogram was performed. There is marked dilatation of the intra and extrahepatic biliary tree. The common bile duct measures up to 15 mm in diameter. There is complete occlusion of the common bile duct distally.  Using a 4 French angled glide catheter and glidewire, the glidewire was advanced into the common bile duct. The wire and catheter were used to dilate the obstruction in the 4 French catheter was successfully passed into the duodenum. A super stiff glidewire was then advanced into the proximal small bowel. The glidewire and Accustick sheath were left in place and secured to the skin. The patient tolerated the procedure well.  COMPLICATIONS: None.  IMPRESSION: 1. Percutaneous transhepatic cholangiogram demonstrates high-grade distal common bile duct obstruction and dilatation of the intra and extrahepatic biliary tree. 2. Successful internalization and dilatation of the distal common bile duct stricture to 4 [place] using a 4 [place] glide catheter and wire combination. 3. 270 cm super stiff glidewire left across of the stenosis and into the proximal small bowel for planned rendezvous stenting. Signed,  Criselda Peaches, MD  [facility]  [facility]   Electronically Signed   By: Dellis Filbert.D.  On: 10/31/2014 14:29   Dg Chest Port 1 View  10/30/2014   CLINICAL DATA:  Follow-up pleural effusion, history of metastatic lung cancer  EXAM: PORTABLE CHEST - 1 VIEW  COMPARISON:  None.  FINDINGS: Large right pleural effusion. No left pleural effusion. Mild  interstitial thickening. No pneumothorax. Stable cardiomediastinal silhouette. No acute osseous abnormality.  IMPRESSION: Large right pleural effusion.   Electronically Signed   By: Kathreen Devoid   On: 10/30/2014 17:42   Dg Ercp  10/31/2014   CLINICAL DATA:  68 year old female with malignant obstructed jaundice. Endovascular stent placement as part of rendezvous procedure.  EXAM: ERCP  TECHNIQUE: Multiple spot images obtained with the fluoroscopic device and submitted for interpretation post-procedure.  COMPARISON:  Percutaneous transhepatic cholangiogram images 12 02/25/2014  FINDINGS: The images demonstrate stent placement across of the malignant obstruction of the distal common bile duct. On the final images, the stent is completely deployed proximally and distally. There is some persistent waisting in the region of the known obstruction.  IMPRESSION: Endoscopic stent placement across the malignant distal common bile duct obstruction. There is some persistent waisting of the distal third of the stent in the region of the external compression.  These images were submitted for radiologic interpretation only. Please see the procedural report for the amount of contrast and the fluoroscopy time utilized.   Electronically Signed   By: Jacqulynn Cadet M.D.   On: 10/31/2014 16:36   Dg Ercp  10/31/2014   CLINICAL DATA:  Metastatic lung cancer and painless jaundice. Elevated liver function tests. Biliary duct dilatation on recent CT.  EXAM: ERCP  TECHNIQUE: Multiple spot images obtained with the fluoroscopic device and submitted for interpretation post-procedure.  COMPARISON:  CT 10/30/2014  FINDINGS: Wire was advanced in the expected location of the common bile duct and common hepatic duct. Contrast injection does not demonstrate opacification of the biliary system. Location of the contrast is unknown and could be related to a perforation.  IMPRESSION: The biliary system was not successfully opacified with  contrast. Location of the injected contrast is unknown.  These images were submitted for radiologic interpretation only. Please see the procedural report for the amount of contrast and the fluoroscopy time utilized.   Electronically Signed   By: Markus Daft M.D.   On: 10/31/2014 13:06      Assessment & Plan  *1.  Bile duct obstruction secondary to metastatic lung CA-status post biliary stent placement 2.  Duodenal obstruction-secondary to metastatic CA.  Patient is asymptomatic  Plan for discharge today.  May DC antibiotics.  Watch for signs of gastric outlet obstruction.  Should this occur a duodenal stent can be placed. ** Active Problems:   Obstructive jaundice   Protein-calorie malnutrition, severe   Bile duct obstruction   Metastasis to liver   Jaundice   Pleural effusion   Jaundice of recent onset     LOS: 3 days   Erskine Emery  11/01/2014, 9:20 AM

## 2014-11-01 NOTE — Progress Notes (Signed)
Patient discharged to home, all discharge medications and instructions reviewed and questions answered.  Patient to be assisted to vehicle by wheelchair.  

## 2014-11-01 NOTE — Discharge Summary (Addendum)
Physician Discharge Summary  Kristina Lawson VVO:[id number] DOB: September 18, 1946 DOA: 10/29/2014  PCP: [name], MD  Admit date: 10/29/2014 Discharge date: 11/01/2014  Recommendations for Outpatient Follow-up:  1. As we discussed please note that you will receive a call from interventional radiology about having thoracentesis which is removing the fluid from pleural space from the lungs. 2. Status post placement of a metal uncovered biliary stent  Discharge Diagnoses:  Active Problems:   Obstructive jaundice   Protein-calorie malnutrition, severe   Bile duct obstruction   Metastasis to liver   Jaundice   Pleural effusion   Jaundice of recent onset    Discharge Condition: stable   Diet recommendation: as tolerated   History of present illness:  67 year old female with stage IV lung cancer, brain metastasis diagnosed in 06/2014. Pt received RT to the brain, also receiving RT for cervical lymphadenopathy but has declined systemic chemotherapy previously. Patient presented to [facility] ED with painless jaundice, bilirubin of 15.6, elevated AST 136, ALT 154 and ALP 488. CT abdomen on this admission showed marked progression of widespread metastatic disease involving the liver, spleen, pancreas, upper abdominal and retroperitoneal lymph nodes, mesentery, omentum, and bilateral ovaries. Also seen was diffuse biliary ductal dilatation due to lymphadenopathy in the porta hepatis and pancreas; new large right lateral chest wall metastasis and small left lower lobe pulmonary metastases and new large right pleural effusion.   Assessment/Plan:     Principal Problem: Obstructive jaundice in the setting of lung cancer with liver metastases / bile duct obstruction   CT abdomen on this admission showed marked progression of widespread metastatic disease involving the liver, spleen, pancreas, upper abdominal and retroperitoneal lymph nodes, mesentery, omentum, and bilateral ovaries. Also seen  was diffuse biliary ductal dilatation due to lymphadenopathy in the porta hepatis and pancreas; new large right lateral chest wall metastasis and small left lower lobe pulmonary metastases and new large right pleural effusion.   ERCP attempted but unsuccessful. Patient is status post placement of a metal uncovered biliary stent  Patient is on empiric cipro 400 mg IV Q 12 hours. Antibiotics can be stopped prior to discharge.  Active Problems: Stage IV lung caner with extensive metastatic disease / large right pleural effusion  Pt will follow up with Dr. Alen Blew of oncology in regards to initiating chemotherapy  CXR showed large pleural effusion on the right so order placed for thoracentesis but per patient request this will be done outpatient as mentioned above   Protein-calorie malnutrition, severe  Pt meets criteria for severe MALNUTRITION in the context of chronic illness as evidenced by 20% wt loss in <6 months and reported intake <75% of estimated needs for >1 month.   Anemia of chronic disease  In the setting of malignancy  Hemoglobin stable with no indications for transfusion  Hyponatremia  Likely dehydration versus SIADH from malignancy  DVT Prophylaxis   SCD's bilaterally  Code Status: Full.  Family Communication: plan of care discussed with the patient    IV access:  Peripheral IV  Procedures and diagnostic studies:   Ct Abdomen Pelvis W Contrast 10/30/2014 Marked progression of widespread metastatic disease involving the liver, spleen, pancreas, upper abdominal and retroperitoneal lymph nodes, mesentery, omentum, and bilateral ovaries. Diffuse biliary ductal dilatation due to lymphadenopathy in the porta hepatis and pancreas. New large right lateral chest wall metastasis and small left lower lobe pulmonary metastases. New large right pleural effusion.   Dg Chest Port 1 View 10/30/2014 Large right pleural effusion.  Addendum to procedures  done  today. Ir Biliary Dilatation 10/31/2014 1. Percutaneous transhepatic cholangiogram demonstrates high-grade distal common bile duct obstruction and dilatation of the intra and extrahepatic biliary tree. 2. Successful internalization and dilatation of the distal common bile duct stricture to 4 [place] using a 4 [place] glide catheter and wire combination. 3. 270 cm super stiff glidewire left across of the stenosis and into the proximal small bowel for planned rendezvous stenting. Signed, Criselda Peaches, MD [facility] Electronically Signed By: Jacqulynn Cadet M.D. On: 10/31/2014 14:29   Ir Ptc 10/31/2014 1. Percutaneous transhepatic cholangiogram demonstrates high-grade distal common bile duct obstruction and dilatation of the intra and extrahepatic biliary tree. 2. Successful internalization and dilatation of the distal common bile duct stricture to 4 [place] using a 4 [place] glide catheter and wire combination. 3. 270 cm super stiff glidewire left across of the stenosis and into the proximal small bowel for planned rendezvous stenting. Signed, Criselda Peaches, MD [facility] Electronically Signed By: Jacqulynn Cadet M.D. On: 10/31/2014 14:29   Ir US Guide Bx Asp/drain 10/31/2014 1. Percutaneous transhepatic cholangiogram demonstrates high-grade distal common bile duct obstruction and dilatation of the intra and extrahepatic biliary tree. 2. Successful internalization and dilatation of the distal common bile duct stricture to 4 [place] using a 4 [place] glide catheter and wire combination. 3. 270 cm super stiff glidewire left across of the stenosis and into the proximal small bowel for planned rendezvous stenting.  Dg Ercp 10/31/2014 The biliary system was not successfully opacified with contrast. Location of the injected contrast is unknown. These images were  submitted for radiologic interpretation only. Please see the procedural report for the amount of contrast and the fluoroscopy time utilized.   Medical Consultants:  Gastroenterology Interventional radiology   Other Consultants:  None   IAnti-Infectives:   Cipro 10/29/2014 --> 11/01/2014  Fluconazole 10/29/2014 --> 11/01/2014    Signed:  Leisa Lenz, MD  [facility] 11/01/2014, 10:20 AM  Pager #: [phone]   Discharge Exam: Filed Vitals:   11/01/14 0612  BP: 146/64  Pulse: 110  Temp: 97.9 F (36.6 C)  Resp: 18   Filed Vitals:   10/31/14 1640 10/31/14 1700 10/31/14 2221 11/01/14 0612  BP:  170/85 128/76 146/64  Pulse: 111 113 119 110  Temp:  97.2 F (36.2 C) 97.5 F (36.4 C) 97.9 F (36.6 C)  TempSrc:  Axillary Oral Oral  Resp: _0 Height:      Weight:      SpO2: 96% 92% 100% 93%    General: Pt is alert, follows commands appropriately, not in acute distress Cardiovascular: Regular rate and rhythm, S1/S2 +, no murmurs Respiratory: Diminished lung sounds on the right, no wheezing Abdominal: Soft, non tender, non distended, bowel sounds +, no guarding Extremities: no edema, no cyanosis, pulses palpable bilaterally DP and PT Neuro: Grossly nonfocal  Discharge Instructions  Discharge Instructions    Call MD for:  persistant dizziness or light-headedness    Complete by:  As directed      Call MD for:  persistant nausea and vomiting    Complete by:  As directed      Call MD for:  redness, tenderness, or signs of infection (pain, swelling, redness, odor or green/yellow discharge around incision site)    Complete by:  As directed      Call MD for:  severe uncontrolled pain    Complete by:  As directed  Diet - low sodium heart healthy    Complete by:  As directed      Discharge instructions    Complete by:  As directed   1. As we discussed please note that you will receive a call from interventional radiology about having  thoracentesis which is removing the fluid from pleural space from the lungs. 2. Status post placement of a metal uncovered biliary stent     Increase activity slowly    Complete by:  As directed             Medication List    STOP taking these medications        hyaluronate sodium Gel     LORazepam 0.5 MG tablet  Commonly known as:  ATIVAN      TAKE these medications        bisoprolol 5 MG tablet  Commonly known as:  ZEBETA  Take 1 tablet (5 mg total) by mouth daily.     Fish Oil 1200 MG Caps  Take 2,400 mg by mouth daily.     HYDROcodone-acetaminophen 5-325 MG per tablet  Commonly known as:  NORCO/VICODIN  1 tablet every 6 (six) hours as needed for moderate pain or severe pain.     levETIRAcetam 500 MG tablet  Commonly known as:  KEPPRA  Take 500 mg by mouth.     metoCLOPramide 10 MG tablet  Commonly known as:  REGLAN  Take 1 tablet (10 mg total) by mouth 4 (four) times daily.     nystatin 100000 UNIT/ML suspension  Commonly known as:  MYCOSTATIN  Take 5 mLs (500,000 Units total) by mouth 4 (four) times daily.     prochlorperazine 10 MG tablet  Commonly known as:  COMPAZINE  Take 1 tablet (10 mg total) by mouth every 6 (six) hours as needed for nausea or vomiting.     Vitamin D 2000 UNITS tablet  Take 2,000 Units by mouth daily.           Follow-up Information    Follow up with [facility] TOM, MD. Schedule an appointment as soon as possible for a visit in 2 weeks.   Specialty:  Family Medicine   Why:  Follow up appt after recent hospitalization   Contact information:   [address] [place] [address] [phone]       Follow up with Erskine Emery, MD. Schedule an appointment as soon as possible for a visit in 1 month.   Specialty:  Gastroenterology   Why:  Follow up appt after recent hospitalization   Contact information:   [address] [place] [address] [phone]        The results of significant diagnostics  from this hospitalization (including imaging, microbiology, ancillary and laboratory) are listed below for reference.    Significant Diagnostic Studies: Dg Abd 1 View  11/01/2014   CLINICAL DATA:  Recent biliary stent placement. History of obstructive jaundice.  EXAM: ABDOMEN - 1 VIEW  COMPARISON:  10/31/2014  FINDINGS: A metallic biliary stent is present. There is narrowing of the stent along the distal aspect and similar to the ERCP images. There is contrast in the colon. Areas of lucency in the mid abdomen are nonspecific. Limited evaluation for free air.  IMPRESSION: Metallic biliary stent as described.  Nonspecific bowel gas pattern. Limited evaluation for free air on this examination.   Electronically Signed   By: Markus Daft M.D.   On: 11/01/2014 09:33   Ct  Soft Tissue Neck W Contrast  10/11/2014   CLINICAL DATA:  Lung cancer.  LEFT-sided neck swelling for 2 weeks.  EXAM: CT NECK WITH CONTRAST  TECHNIQUE: Multidetector CT imaging of the neck was performed using the standard protocol following the bolus administration of intravenous contrast.  CONTRAST:  192m OMNIPAQUE IOHEXOL 300 MG/ML  SOLN  COMPARISON:  MR brain 06/15/2014.  PET scan 07/03/2014.  FINDINGS: BILATERAL centrally necrotic level 2, level 3, ground level 4 lymph nodes are seen in the neck with mass effect on the LEFT internal jugular vein without visible thrombus. As seen on image 34 series 2, a retromandibular node measures 38 x 23 mm cross-section. As seen on image 78 series 2, conglomerate RIGHT level 4 nodal mass measures 31 x 29 mm.  10 x 14 mm RIGHT thyroid nodule, nonspecific. This was mildly hypermetabolic on prior PET scan but appears roughly stable. No airway compromise. RIGHT pleural effusion. Mediastinal adenopathy. Vascular calcification.  Intracranial metastatic disease difficult to visualize on this exam although osseous spread to the LEFT temporalis region appears improved.  IMPRESSION: Worsening metastatic disease to the  cervical lymph nodes as described. These have significantly progressed from prior PET scan.   Electronically Signed   By: JRolla FlattenM.D.   On: 10/11/2014 15:13   Ct Abdomen Pelvis W Contrast  10/30/2014   CLINICAL DATA:  Metastatic lung carcinoma. Painless jaundice. Elevated liver function tests.  EXAM: CT ABDOMEN AND PELVIS WITH CONTRAST  TECHNIQUE: Multidetector CT imaging of the abdomen and pelvis was performed using the standard protocol following bolus administration of intravenous contrast.  CONTRAST:  1032mOMNIPAQUE IOHEXOL 300 MG/ML SOLN, 1 OMNIPAQUE IOHEXOL 300 MG/ML SOLN  COMPARISON:  06/18/2014  FINDINGS: Lower Chest: New right pleural effusion and right lower lung atelectasis. Incomplete visualization of mediastinal lymphadenopathy. New right lateral chest wall soft tissue mass measuring 3.4 x 6.1 cm on image 9. Increased pulmonary nodules seen in the lateral left lung base measuring up to 13 mm, consistent with pulmonary metastases.  Hepatobiliary: New diffuse liver metastases, with index lesion in the peripheral right hepatic lobe measuring 3.8 cm on image 25. New diffuse biliary ductal dilatation is also seen due to lymphadenopathy in the porta hepatis and peripancreatic region.  Pancreas: Multiple new soft tissue masses are seen involving the pancreas diffusely which may be due to peripancreatic lymphadenopathy or pancreatic metastases. Pancreatic ductal dilatation noted in the pancreatic body.  Spleen: Several new small low-attenuation splenic lesions are seen, consistent with splenic metastases.  Adrenal Glands: Left adrenal mass measures 3.2 x 3.0 cm compared to 3.2 x 3.4 cm on previous study.  Kidneys/Urinary Tract: Horseshoe kidney again noted. No evidence of renal masses or hydronephrosis.  Stomach/Bowel/Peritoneum: No evidence of bowel obstruction. Mild ascites is seen. Two solid masses are seen within the omental fat measuring 2.0 cm on image 58 and 2.8 cm on image 74.   Vascular/Lymphatic: New bulky lymphadenopathy in the porta hepatis, peripancreatic regions, abdominal retroperitoneum, and small bowel mesentery.  Reproductive: New large bilateral adnexal cystic and solid masses are seen measuring 6.9 x 8.6 cm on the right and 6.4 x 8.9 cm on the left. These are consistent with ovarian metastases.  Other:  None.  Musculoskeletal:  No suspicious bone lesions identified.  IMPRESSION: Marked progression of widespread metastatic disease involving the liver, spleen, pancreas, upper abdominal and retroperitoneal lymph nodes, mesentery, omentum, and bilateral ovaries.  Diffuse biliary ductal dilatation due to lymphadenopathy in the porta hepatis and pancreas.  New  large right lateral chest wall metastasis and small left lower lobe pulmonary metastases. New large right pleural effusion.   Electronically Signed   By: Earle Gell M.D.   On: 10/30/2014 10:08   Ir Biliary Dilitation  10/31/2014   CLINICAL DATA:  68 year old female with a malignant obstructed jaundice and profound hyperbilirubinemia. Patient underwent unsuccessful ERCP earlier today. Percutaneous transhepatic cholangiogram is warranted. We will attempt to cross the lesion with a wire and return the patient to endoscopy for rendezvous stent placement. This would be done to facilitate the patient's early discharged tomorrow given the Christmas holiday. If the lesion cannot be crossed with relative ease, then a percutaneous biliary drain will be left to decompress the system and stenting can be performed percutaneously at a later date.  EXAM: PERCUTANEOUS TRANSHEPATIC CHOLANGIOGRAM; BILIARY DILATION; IR ULTRASOUND GUIDANCE  Date: 10/31/2014  PROCEDURE: 1. Ultrasound-guided percutaneous transhepatic access of the biliary tree 2. Percutaneous transhepatic cholangiogram 3. Internalization of a 4 French catheter through the high-grade distal common bile duct stenosis and into the duodenum 4. Placement of a super stiff glidewire  into the proximal small bowel Interventional Radiologist:  Criselda Peaches, MD  ANESTHESIA/SEDATION: Moderate (conscious) sedation was used. Two mg Versed, 100 mcg Fentanyl were administered intravenously. The patient's vital signs were monitored continuously by radiology nursing throughout the procedure.  Sedation Time: 50 minutes  MEDICATIONS: 400 mg ciprofloxacin administered endoscopy earlier today.  FLUOROSCOPY TIME:  11 minutes 30 seconds  487 mGy  CONTRAST:  30 mL Omnipaque 300  TECHNIQUE: Informed consent was obtained from the patient following explanation of the procedure, risks, benefits and alternatives. The patient understands, agrees and consents for the procedure. All questions were addressed. A time out was performed.  Maximal barrier sterile technique utilized including caps, mask, sterile gowns, sterile gloves, large sterile drape, hand hygiene, and Betadine skin prep.  The mid epigastric region was interrogated with ultrasound. A suitable segment 2 bile duct was selected. Local anesthesia was attained by infiltration with 1% lidocaine. Under real-time sonographic guidance, the bile duct was punctured with a 21 gauge Accustick needle. A wire was advanced into the central bile ducts. The 4 [place] Accustick sheath was then advanced over the wire and into the liver.  A percutaneous transhepatic cholangiogram was performed. There is marked dilatation of the intra and extrahepatic biliary tree. The common bile duct measures up to 15 mm in diameter. There is complete occlusion of the common bile duct distally.  Using a 4 French angled glide catheter and glidewire, the glidewire was advanced into the common bile duct. The wire and catheter were used to dilate the obstruction in the 4 French catheter was successfully passed into the duodenum. A super stiff glidewire was then advanced into the proximal small bowel. The glidewire and Accustick sheath were left in place and secured to the skin. The patient  tolerated the procedure well.  COMPLICATIONS: None.  IMPRESSION: 1. Percutaneous transhepatic cholangiogram demonstrates high-grade distal common bile duct obstruction and dilatation of the intra and extrahepatic biliary tree. 2. Successful internalization and dilatation of the distal common bile duct stricture to 4 [place] using a 4 [place] glide catheter and wire combination. 3. 270 cm super stiff glidewire left across of the stenosis and into the proximal small bowel for planned rendezvous stenting. Signed,  Criselda Peaches, MD  [facility]  [facility] Radiology   Electronically Signed   By: Jacqulynn Cadet M.D.   On: 10/31/2014 14:29   Ir  Ptc  10/31/2014   CLINICAL DATA:  68 year old female with a malignant obstructed jaundice and profound hyperbilirubinemia. Patient underwent unsuccessful ERCP earlier today. Percutaneous transhepatic cholangiogram is warranted. We will attempt to cross the lesion with a wire and return the patient to endoscopy for rendezvous stent placement. This would be done to facilitate the patient's early discharged tomorrow given the Christmas holiday. If the lesion cannot be crossed with relative ease, then a percutaneous biliary drain will be left to decompress the system and stenting can be performed percutaneously at a later date.  EXAM: PERCUTANEOUS TRANSHEPATIC CHOLANGIOGRAM; BILIARY DILATION; IR ULTRASOUND GUIDANCE  Date: 10/31/2014  PROCEDURE: 1. Ultrasound-guided percutaneous transhepatic access of the biliary tree 2. Percutaneous transhepatic cholangiogram 3. Internalization of a 4 French catheter through the high-grade distal common bile duct stenosis and into the duodenum 4. Placement of a super stiff glidewire into the proximal small bowel Interventional Radiologist:  Criselda Peaches, MD  ANESTHESIA/SEDATION: Moderate (conscious) sedation was used. Two mg Versed, 100 mcg Fentanyl were administered intravenously. The patient's  vital signs were monitored continuously by radiology nursing throughout the procedure.  Sedation Time: 50 minutes  MEDICATIONS: 400 mg ciprofloxacin administered endoscopy earlier today.  FLUOROSCOPY TIME:  11 minutes 30 seconds  487 mGy  CONTRAST:  30 mL Omnipaque 300  TECHNIQUE: Informed consent was obtained from the patient following explanation of the procedure, risks, benefits and alternatives. The patient understands, agrees and consents for the procedure. All questions were addressed. A time out was performed.  Maximal barrier sterile technique utilized including caps, mask, sterile gowns, sterile gloves, large sterile drape, hand hygiene, and Betadine skin prep.  The mid epigastric region was interrogated with ultrasound. A suitable segment 2 bile duct was selected. Local anesthesia was attained by infiltration with 1% lidocaine. Under real-time sonographic guidance, the bile duct was punctured with a 21 gauge Accustick needle. A wire was advanced into the central bile ducts. The 4 [place] Accustick sheath was then advanced over the wire and into the liver.  A percutaneous transhepatic cholangiogram was performed. There is marked dilatation of the intra and extrahepatic biliary tree. The common bile duct measures up to 15 mm in diameter. There is complete occlusion of the common bile duct distally.  Using a 4 French angled glide catheter and glidewire, the glidewire was advanced into the common bile duct. The wire and catheter were used to dilate the obstruction in the 4 French catheter was successfully passed into the duodenum. A super stiff glidewire was then advanced into the proximal small bowel. The glidewire and Accustick sheath were left in place and secured to the skin. The patient tolerated the procedure well.  COMPLICATIONS: None.  IMPRESSION: 1. Percutaneous transhepatic cholangiogram demonstrates high-grade distal common bile duct obstruction and dilatation of the intra and extrahepatic biliary  tree. 2. Successful internalization and dilatation of the distal common bile duct stricture to 4 [place] using a 4 [place] glide catheter and wire combination. 3. 270 cm super stiff glidewire left across of the stenosis and into the proximal small bowel for planned rendezvous stenting. Signed,  Criselda Peaches, MD  [facility]  [facility]iology   Electronically Signed   By: Jacqulynn Cadet M.D.   On: 10/31/2014 14:29   Ir US Guide Bx Asp/drain  10/31/2014   CLINICAL DATA:  68 year old female with a malignant obstructed jaundice and profound hyperbilirubinemia. Patient underwent unsuccessful ERCP earlier today. Percutaneous transhepatic cholangiogram is warranted. We will attempt to cross the  lesion with a wire and return the patient to endoscopy for rendezvous stent placement. This would be done to facilitate the patient's early discharged tomorrow given the Christmas holiday. If the lesion cannot be crossed with relative ease, then a percutaneous biliary drain will be left to decompress the system and stenting can be performed percutaneously at a later date.  EXAM: PERCUTANEOUS TRANSHEPATIC CHOLANGIOGRAM; BILIARY DILATION; IR ULTRASOUND GUIDANCE  Date: 10/31/2014  PROCEDURE: 1. Ultrasound-guided percutaneous transhepatic access of the biliary tree 2. Percutaneous transhepatic cholangiogram 3. Internalization of a 4 French catheter through the high-grade distal common bile duct stenosis and into the duodenum 4. Placement of a super stiff glidewire into the proximal small bowel Interventional Radiologist:  Criselda Peaches, MD  ANESTHESIA/SEDATION: Moderate (conscious) sedation was used. Two mg Versed, 100 mcg Fentanyl were administered intravenously. The patient's vital signs were monitored continuously by radiology nursing throughout the procedure.  Sedation Time: 50 minutes  MEDICATIONS: 400 mg ciprofloxacin administered endoscopy earlier today.  FLUOROSCOPY  TIME:  11 minutes 30 seconds  487 mGy  CONTRAST:  30 mL Omnipaque 300  TECHNIQUE: Informed consent was obtained from the patient following explanation of the procedure, risks, benefits and alternatives. The patient understands, agrees and consents for the procedure. All questions were addressed. A time out was performed.  Maximal barrier sterile technique utilized including caps, mask, sterile gowns, sterile gloves, large sterile drape, hand hygiene, and Betadine skin prep.  The mid epigastric region was interrogated with ultrasound. A suitable segment 2 bile duct was selected. Local anesthesia was attained by infiltration with 1% lidocaine. Under real-time sonographic guidance, the bile duct was punctured with a 21 gauge Accustick needle. A wire was advanced into the central bile ducts. The 4 [place] Accustick sheath was then advanced over the wire and into the liver.  A percutaneous transhepatic cholangiogram was performed. There is marked dilatation of the intra and extrahepatic biliary tree. The common bile duct measures up to 15 mm in diameter. There is complete occlusion of the common bile duct distally.  Using a 4 French angled glide catheter and glidewire, the glidewire was advanced into the common bile duct. The wire and catheter were used to dilate the obstruction in the 4 French catheter was successfully passed into the duodenum. A super stiff glidewire was then advanced into the proximal small bowel. The glidewire and Accustick sheath were left in place and secured to the skin. The patient tolerated the procedure well.  COMPLICATIONS: None.  IMPRESSION: 1. Percutaneous transhepatic cholangiogram demonstrates high-grade distal common bile duct obstruction and dilatation of the intra and extrahepatic biliary tree. 2. Successful internalization and dilatation of the distal common bile duct stricture to 4 [place] using a 4 [place] glide catheter and wire combination. 3. 270 cm super stiff glidewire left across  of the stenosis and into the proximal small bowel for planned rendezvous stenting. Signed,  Criselda Peaches, MD  [facility]  [facility]   Electronically Signed   By: Jacqulynn Cadet M.D.   On: 10/31/2014 14:29   Dg Chest Port 1 View  10/30/2014   CLINICAL DATA:  Follow-up pleural effusion, history of metastatic lung cancer  EXAM: PORTABLE CHEST - 1 VIEW  COMPARISON:  None.  FINDINGS: Large right pleural effusion. No left pleural effusion. Mild interstitial thickening. No pneumothorax. Stable cardiomediastinal silhouette. No acute osseous abnormality.  IMPRESSION: Large right pleural effusion.   Electronically Signed   By: Kathreen Devoid   On: 10/30/2014 17:42  Dg Ercp  10/31/2014   CLINICAL DATA:  68 year old female with malignant obstructed jaundice. Endovascular stent placement as part of rendezvous procedure.  EXAM: ERCP  TECHNIQUE: Multiple spot images obtained with the fluoroscopic device and submitted for interpretation post-procedure.  COMPARISON:  Percutaneous transhepatic cholangiogram images 12 02/25/2014  FINDINGS: The images demonstrate stent placement across of the malignant obstruction of the distal common bile duct. On the final images, the stent is completely deployed proximally and distally. There is some persistent waisting in the region of the known obstruction.  IMPRESSION: Endoscopic stent placement across the malignant distal common bile duct obstruction. There is some persistent waisting of the distal third of the stent in the region of the external compression.  These images were submitted for radiologic interpretation only. Please see the procedural report for the amount of contrast and the fluoroscopy time utilized.   Electronically Signed   By: Jacqulynn Cadet M.D.   On: 10/31/2014 16:36   Dg Ercp  10/31/2014   CLINICAL DATA:  Metastatic lung cancer and painless jaundice. Elevated liver function tests. Biliary duct  dilatation on recent CT.  EXAM: ERCP  TECHNIQUE: Multiple spot images obtained with the fluoroscopic device and submitted for interpretation post-procedure.  COMPARISON:  CT 10/30/2014  FINDINGS: Wire was advanced in the expected location of the common bile duct and common hepatic duct. Contrast injection does not demonstrate opacification of the biliary system. Location of the contrast is unknown and could be related to a perforation.  IMPRESSION: The biliary system was not successfully opacified with contrast. Location of the injected contrast is unknown.  These images were submitted for radiologic interpretation only. Please see the procedural report for the amount of contrast and the fluoroscopy time utilized.   Electronically Signed   By: Markus Daft M.D.   On: 10/31/2014 13:06    Microbiology: No results found for this or any previous visit (from the past 240 hour(s)).   Labs: Basic Metabolic Panel:  Recent Labs Lab 10/25/14 1515 10/29/14 1753 10/30/14 0449 11/01/14 0434  NA 131* 130* 130* 131*  K 3.6* 3.6 3.7 3.3*  CL 92* 99 99 100  CO2 _0 GLUCOSE 133* 99 104* 132*  BUN _1 CREATININE 0.65 0.38* 0.44* 0.39*  CALCIUM 9.6 8.8 8.3* 8.0*  MG  --  1.8  --   --   PHOS  --  2.8  --   --    Liver Function Tests:  Recent Labs Lab 10/25/14 1515 10/29/14 1753 10/30/14 0449  AST 140* 136* 124*  ALT 181* 154* 139*  ALKPHOS 534* 488* 450*  BILITOT 15.2* 15.6* 14.6*  PROT 6.4 5.9* 5.3*  ALBUMIN 2.6* 2.4* 2.2*   No results for input(s): LIPASE, AMYLASE in the last 168 hours. No results for input(s): AMMONIA in the last 168 hours. CBC:  Recent Labs Lab 10/25/14 1515 10/29/14 1753 10/30/14 0449 11/01/14 0434  WBC 9.3 8.6 7.8 10.4  NEUTROABS 7.5  --   --   --   HGB 11.4* 10.4* 9.2* 9.3*  HCT 33.4* 30.5* 27.2* 28.5*  MCV 86.1 85.7 85.3 87.4  PLT 371 274 328 314   Cardiac Enzymes: No results for input(s): CKTOTAL, CKMB, CKMBINDEX, TROPONINI in the last  168 hours. BNP: BNP (last 3 results) No results for input(s): PROBNP in the last 8760 hours. CBG: No results for input(s): GLUCAP in the last 168 hours.  Time coordinating discharge: Over 30 minutes

## 2014-11-03 DIAGNOSIS — K831 Obstruction of bile duct: Secondary | ICD-10-CM | POA: Insufficient documentation

## 2014-11-04 ENCOUNTER — Other Ambulatory Visit: Payer: Self-pay | Admitting: Internal Medicine

## 2014-11-04 ENCOUNTER — Ambulatory Visit: Payer: Medicare PPO | Admitting: Radiation Oncology

## 2014-11-04 ENCOUNTER — Encounter (HOSPITAL_COMMUNITY): Payer: Self-pay | Admitting: Gastroenterology

## 2014-11-04 DIAGNOSIS — C770 Secondary and unspecified malignant neoplasm of lymph nodes of head, face and neck: Secondary | ICD-10-CM | POA: Diagnosis not present

## 2014-11-04 DIAGNOSIS — J90 Pleural effusion, not elsewhere classified: Secondary | ICD-10-CM

## 2014-11-04 DIAGNOSIS — C3492 Malignant neoplasm of unspecified part of left bronchus or lung: Secondary | ICD-10-CM | POA: Diagnosis not present

## 2014-11-04 DIAGNOSIS — Z923 Personal history of irradiation: Secondary | ICD-10-CM | POA: Diagnosis not present

## 2014-11-04 DIAGNOSIS — Z87891 Personal history of nicotine dependence: Secondary | ICD-10-CM | POA: Diagnosis not present

## 2014-11-04 DIAGNOSIS — Z510 Encounter for antineoplastic radiation therapy: Secondary | ICD-10-CM | POA: Diagnosis not present

## 2014-11-05 ENCOUNTER — Ambulatory Visit: Payer: Medicare PPO

## 2014-11-05 ENCOUNTER — Ambulatory Visit (HOSPITAL_COMMUNITY): Admission: RE | Admit: 2014-11-05 | Payer: Medicare PPO | Source: Ambulatory Visit

## 2014-11-06 ENCOUNTER — Ambulatory Visit: Admission: RE | Admit: 2014-11-06 | Payer: Medicare HMO | Source: Ambulatory Visit | Admitting: Radiation Oncology

## 2014-11-06 ENCOUNTER — Ambulatory Visit: Admission: RE | Admit: 2014-11-06 | Payer: Medicare PPO | Source: Ambulatory Visit

## 2014-11-06 ENCOUNTER — Telehealth: Payer: Self-pay | Admitting: *Deleted

## 2014-11-06 NOTE — Telephone Encounter (Signed)
Called patient, asked if she was feeling okay since she missed her rad tx yesterday," yes, I'm fine,I'll be there today at 1:30pm for treatment"thanked this RN for calling and checking on her,reminded her that Dr.Moody's partner,Dr.Manning will see her after her treatment today 9:14 AM
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
## 2014-11-07 ENCOUNTER — Encounter: Payer: Self-pay | Admitting: Radiation Oncology

## 2014-11-07 ENCOUNTER — Ambulatory Visit
Admission: RE | Admit: 2014-11-07 | Discharge: 2014-11-07 | Disposition: A | Discharge status: Home / Self Care | Payer: Medicare PPO | Source: Ambulatory Visit | Attending: Radiation Oncology | Admitting: Radiation Oncology

## 2014-11-07 ENCOUNTER — Telehealth: Payer: Self-pay | Admitting: Oncology

## 2014-11-07 ENCOUNTER — Ambulatory Visit
Admission: RE | Admit: 2014-11-07 | Discharge: 2014-11-07 | Disposition: A | Discharge status: Home / Self Care | Payer: Medicare HMO | Source: Ambulatory Visit | Attending: Radiation Oncology | Admitting: Radiation Oncology

## 2014-11-07 ENCOUNTER — Ambulatory Visit (HOSPITAL_BASED_OUTPATIENT_CLINIC_OR_DEPARTMENT_OTHER): Payer: Medicare PPO | Admitting: Oncology

## 2014-11-07 VITALS — BP 113/64 | HR 93 | Temp 97.6°F | Resp 12 | Wt 148.6 lb

## 2014-11-07 DIAGNOSIS — C787 Secondary malignant neoplasm of liver and intrahepatic bile duct: Secondary | ICD-10-CM

## 2014-11-07 DIAGNOSIS — J90 Pleural effusion, not elsewhere classified: Secondary | ICD-10-CM

## 2014-11-07 DIAGNOSIS — Z510 Encounter for antineoplastic radiation therapy: Secondary | ICD-10-CM | POA: Diagnosis not present

## 2014-11-07 DIAGNOSIS — C3492 Malignant neoplasm of unspecified part of left bronchus or lung: Secondary | ICD-10-CM

## 2014-11-07 DIAGNOSIS — C7931 Secondary malignant neoplasm of brain: Secondary | ICD-10-CM

## 2014-11-07 DIAGNOSIS — C770 Secondary and unspecified malignant neoplasm of lymph nodes of head, face and neck: Secondary | ICD-10-CM

## 2014-11-07 DIAGNOSIS — C3411 Malignant neoplasm of upper lobe, right bronchus or lung: Secondary | ICD-10-CM

## 2014-11-07 NOTE — Telephone Encounter (Signed)
gv and printed appt sched and avs for pt for Jan 2016 °

## 2014-11-07 NOTE — Progress Notes (Addendum)
She rates her pain as a 5 on a scale of 0-10. Pt complains of, Fatigue, Generalized Weakness, Poor Appetite, Pain  Cramping and Pain Occurs  Intermittently. Pt reports the pain, edema over bilateral lower extremities, and yellow of the whites of her eyes which started approximately one week ago, after stent placement in her liver.  Pt denies dysphagia. Pt reports a high calorie diet plus nutritional diet given by mouth, formula-boost", 1-2 cans per day. Oral exam reveals thick white coating on tongue with dry mouth. Pt reports they are still using the Nystatin swish.  Skin exam warm, dry and intact, left neck lymph nodes present.

## 2014-11-07 NOTE — Progress Notes (Signed)
Weekly Management Note:  Site: Bilateral neck Current Dose:  300  cGy Projected Dose: 3000  cGy  Narrative: The patient is seen today for routine under treatment assessment. CBCT/MVCT images/port films were reviewed. The chart was reviewed.   She was supposed to have started her ration therapy this past Tuesday, and thus is seen today for her weekly management visit.  She was recently discharged in the hospital after undergoing placement of a biliary stent for biliary obstruction.  Her jaundice is improved.  She continues with her nystatin swish and swallows for her suspected oral candidiasis.  She is off dexamethasone.  He is scheduled for a follow-up MRI scan on January 15.  Physical Examination:  Filed Vitals:   11/07/14 1524  BP: 113/64  Pulse: 93  Temp: 97.6 F (36.4 C)  Resp: 12  .  Weight: 148 lb 9.6 oz (67.405 kg).  She has bilateral upper cervical lymphadenopathy.  This is slightly tender to palpation.  She does have mild to moderate icteric sclera.  Extremities: She has 3+ ankle edema.  Laboratory data: Lab Results  Component Value Date   WBC 10.4 11/01/2014   HGB 9.3* 11/01/2014   HCT 28.5* 11/01/2014   MCV 87.4 11/01/2014   PLT 314 11/01/2014   CMP     Component Value Date/Time   NA 131* 11/01/2014 0434   K 3.3* 11/01/2014 0434   CL 100 11/01/2014 0434   CO2 21 11/01/2014 0434   GLUCOSE 132* 11/01/2014 0434   BUN 11 11/01/2014 0434   BUN 8.2 10/11/2014 1614   CREATININE 0.39* 11/01/2014 0434   CREATININE 0.8 10/11/2014 1614   CREATININE 0.79 02/06/2014 0908   CALCIUM 8.0* 11/01/2014 0434   PROT 5.3* 10/30/2014 0449   ALBUMIN 2.2* 10/30/2014 0449   AST 124* 10/30/2014 0449   ALT 139* 10/30/2014 0449   ALKPHOS 450* 10/30/2014 0449   BILITOT 14.6* 10/30/2014 0449   GFRNONAA >90 11/01/2014 0434   GFRNONAA 79 02/06/2014 0908   GFRAA >90 11/01/2014 0434   GFRAA >89 02/06/2014 0908     Impression: Tolerating radiation therapy well.  Satisfactory progress.  I  instructed her to cut back on her salt and elevate her legs to reduce her ankle edema.  Plan: Continue radiation therapy as planned.

## 2014-11-07 NOTE — Progress Notes (Signed)
Hematology and Oncology Follow Up Visit  Kristina Lawson [id number] 09-25-1946 68 y.o. 11/07/2014 4:34 PM Kristina Lawson, MDPickard, Cammie Mcgee, MD   Principle Diagnosis: 68 year old with stage IV lung cancer. She presented with multiple brain metastasis. She presented with seizure and left sided weakness. In August of 2015. She underwent staging studies which showed a mass in the right lung measuring 2.6 x 2.3 cm in the right upper lobe as well as lymphadenopathy. This was biopsy proven to be poorly differentiated cancer with neuroendocrine features. 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  Current therapy: Under consideration for systemic therapy.  Interim History:  [name] presents today for a followup visit with her son. Since her last visit, she was hospitalized for painless jaundice and found to have a biliary obstruction. Staging CT scan showed widespread cancer metastasis and likely lymphatic obstruction of her biliary tree. She underwent ERCP and stent placement and have been recently discharged. Since her discharge, she has been doing poorly. Her mobility has been very limited and performance status has declined rapidly. Her jaundice is about the same and her appetite is very poor. Her urine has been clearing at this time. She denied seizure activity or any other neurological symptoms. She has not reported any headaches or blurry vision did not report any syncope. She does report any cough or shortness of breath. She does report exertional dyspnea. Desirable any wheezing or hemoptysis. She does not report any chest pain or palpitation. Desirable any nausea or vomiting or abdominal pain. She does not report any hematochezia or melena. Does not report any urinary complaints. Rest of her review of systems unremarkable.   Medications: I have  reviewed the patient's current medications.  Current Outpatient Prescriptions  Medication Sig Dispense Refill  . bisoprolol (ZEBETA) 5 MG tablet Take 1 tablet (5 mg total) by mouth daily. 30 tablet 5  . Cholecalciferol (VITAMIN D) 2000 UNITS tablet Take 2,000 Units by mouth daily.    Marland Kitchen HYDROcodone-acetaminophen (NORCO/VICODIN) 5-325 MG per tablet 1 tablet every 6 (six) hours as needed for moderate pain or severe pain.     Marland Kitchen levETIRAcetam (KEPPRA) 500 MG tablet Take 500 mg by mouth.    . metoCLOPramide (REGLAN) 10 MG tablet Take 1 tablet (10 mg total) by mouth 4 (four) times daily. (Patient taking differently: Take 10 mg by mouth daily. ) 60 tablet 1  . nystatin (MYCOSTATIN) 100000 UNIT/ML suspension Take 5 mLs (500,000 Units total) by mouth 4 (four) times daily. (Patient taking differently: Take 5 mLs by mouth daily. ) 240 mL 0  . Omega-3 Fatty Acids (FISH OIL) 1200 MG CAPS Take 2,400 mg by mouth daily.     . prochlorperazine (COMPAZINE) 10 MG tablet Take 1 tablet (10 mg total) by mouth every 6 (six) hours as needed for nausea or vomiting. 40 tablet 1   No current facility-administered medications for this visit.     Allergies:  Allergies  Allergen Reactions  . Penicillins Other (See Comments)    unknown    Past Medical History, Surgical history, Social history, and Family History were reviewed and updated.   Physical Exam: ECOG: 3 General appearance: Alert woman chronically ill-appearing appeared in no distress. Head: Normocephalic, without obvious abnormality Neck: no adenopathy Lymph nodes: Cervical, supraclavicular, and axillary nodes normal. Heart:regular rate and rhythm, S1, S2 normal, no murmur, click, rub or gallop Lung:chest  clear, no wheezing, rales, normal symmetric air entry. Decrease breath sounds at the bases. Abdomin: soft, non-tender, without masses or organomegaly EXT:no erythema, induration, or nodules. 2+ edema bilaterally. Neurological: No deficits.  Lab  Results: Lab Results  Component Value Date   WBC 10.4 11/01/2014   HGB 9.3* 11/01/2014   HCT 28.5* 11/01/2014   MCV 87.4 11/01/2014   PLT 314 11/01/2014     Chemistry      Component Value Date/Time   NA 131* 11/01/2014 0434   K 3.3* 11/01/2014 0434   CL 100 11/01/2014 0434   CO2 21 11/01/2014 0434   BUN 11 11/01/2014 0434   BUN 8.2 10/11/2014 1614   CREATININE 0.39* 11/01/2014 0434   CREATININE 0.8 10/11/2014 1614   CREATININE 0.79 02/06/2014 0908      Component Value Date/Time   CALCIUM 8.0* 11/01/2014 0434   ALKPHOS 450* 10/30/2014 0449   AST 124* 10/30/2014 0449   ALT 139* 10/30/2014 0449   BILITOT 14.6* 10/30/2014 0449      EXAM: CT ABDOMEN AND PELVIS WITH CONTRAST  TECHNIQUE: Multidetector CT imaging of the abdomen and pelvis was performed using the standard protocol following bolus administration of intravenous contrast.  CONTRAST: 186mL OMNIPAQUE IOHEXOL 300 MG/ML SOLN, 1 OMNIPAQUE IOHEXOL 300 MG/ML SOLN  COMPARISON: 06/18/2014  FINDINGS: Lower Chest: New right pleural effusion and right lower lung atelectasis. Incomplete visualization of mediastinal lymphadenopathy. New right lateral chest wall soft tissue mass measuring 3.4 x 6.1 cm on image 9. Increased pulmonary nodules seen in the lateral left lung base measuring up to 13 mm, consistent with pulmonary metastases.  Hepatobiliary: New diffuse liver metastases, with index lesion in the peripheral right hepatic lobe measuring 3.8 cm on image 25. New diffuse biliary ductal dilatation is also seen due to lymphadenopathy in the porta hepatis and peripancreatic region.  Pancreas: Multiple new soft tissue masses are seen involving the pancreas diffusely which may be due to peripancreatic lymphadenopathy or pancreatic metastases. Pancreatic ductal dilatation noted in the pancreatic body.  Spleen: Several new small low-attenuation splenic lesions are seen, consistent with splenic  metastases.  Adrenal Glands: Left adrenal mass measures 3.2 x 3.0 cm compared to 3.2 x 3.4 cm on previous study.  Kidneys/Urinary Tract: Horseshoe kidney again noted. No evidence of renal masses or hydronephrosis.  Stomach/Bowel/Peritoneum: No evidence of bowel obstruction. Mild ascites is seen. Two solid masses are seen within the omental fat measuring 2.0 cm on image 58 and 2.8 cm on image 74.  Vascular/Lymphatic: New bulky lymphadenopathy in the porta hepatis, peripancreatic regions, abdominal retroperitoneum, and small bowel mesentery.  Reproductive: New large bilateral adnexal cystic and solid masses are seen measuring 6.9 x 8.6 cm on the right and 6.4 x 8.9 cm on the left. These are consistent with ovarian metastases.  Other: None.  Musculoskeletal: No suspicious bone lesions identified.  IMPRESSION: Marked progression of widespread metastatic disease involving the liver, spleen, pancreas, upper abdominal and retroperitoneal lymph nodes, mesentery, omentum, and bilateral ovaries.  Diffuse biliary ductal dilatation due to lymphadenopathy in the porta hepatis and pancreas.  New large right lateral chest wall metastasis and small left lower lobe pulmonary metastases. New large right pleural effusion.     Impression and Plan:   68 year old woman with the following issues:  1. Right lung mass measuring 2.6 x 2.3 centimeters in the right upper lobe. She has chest adenopathy as well as enlarged adrenal gland that has been biopsied which showed benign findings. She is status post a biopsy  on 07/05/2014 with the pathology confirmed poorly differentiated tumor likely small cell lung cancer.   Her CT scan on 10/30/2014 showed widespread metastatic disease including liver, spleen and diffuse lymphadenopathy. Her performance status is very poor and she is approaching end-stage status. These findings were discussed with the patient and her son today. I have  recommended hospice enrollment or palliative chemotherapy. I feel that chemotherapy will offer very little palliation of her symptoms especially in the setting of hepatic dysfunction and markedly elevated bilirubin. I explained to her that she has very limited life expectancy and she would benefit from hospice involvement immediately.  She would like to think about these findings today and she will let me know whether she is interested in hospice enrollment.  2. Brain metastasis: MRI from 06/25/2014 was reviewed and showed multiple lesions with a dominant lesion in the right occipital lobe. She is status post radiation therapy that have been completed.   3. Neck adenopathy: She is finishing radiation therapy for palliative purposes  4. Pleural effusion: She is scheduled for therapeutic thoracentesis next week.  5. Followup: Will be in the next few weeks sooner if needed to. Also we can rely on hospice to provide follow-up as I fear she will not be able to make it to future clinic appointments given her decline.   [facility], MD 12/31/20154:34 PM

## 2014-11-11 ENCOUNTER — Ambulatory Visit: Payer: Medicare PPO

## 2014-11-11 ENCOUNTER — Ambulatory Visit (HOSPITAL_COMMUNITY): Admission: RE | Admit: 2014-11-11 | Payer: Medicare HMO | Source: Ambulatory Visit

## 2014-11-12 ENCOUNTER — Other Ambulatory Visit: Payer: Self-pay | Admitting: *Deleted

## 2014-11-12 ENCOUNTER — Telehealth: Payer: Self-pay | Admitting: *Deleted

## 2014-11-12 ENCOUNTER — Ambulatory Visit (HOSPITAL_COMMUNITY)
Admission: RE | Admit: 2014-11-12 | Discharge: 2014-11-12 | Disposition: A | Discharge status: Home / Self Care | Payer: Medicare HMO | Source: Ambulatory Visit | Attending: Internal Medicine | Admitting: Internal Medicine

## 2014-11-12 ENCOUNTER — Ambulatory Visit (HOSPITAL_COMMUNITY)
Admission: RE | Admit: 2014-11-12 | Discharge: 2014-11-12 | Disposition: A | Discharge status: Home / Self Care | Payer: Medicare HMO | Source: Ambulatory Visit | Attending: Radiology | Admitting: Radiology

## 2014-11-12 ENCOUNTER — Ambulatory Visit
Admission: RE | Admit: 2014-11-12 | Discharge: 2014-11-12 | Disposition: A | Discharge status: Home / Self Care | Payer: Medicare PPO | Source: Ambulatory Visit | Attending: Radiation Oncology | Admitting: Radiation Oncology

## 2014-11-12 DIAGNOSIS — Z9689 Presence of other specified functional implants: Secondary | ICD-10-CM | POA: Insufficient documentation

## 2014-11-12 DIAGNOSIS — C3490 Malignant neoplasm of unspecified part of unspecified bronchus or lung: Secondary | ICD-10-CM | POA: Diagnosis not present

## 2014-11-12 DIAGNOSIS — K831 Obstruction of bile duct: Secondary | ICD-10-CM | POA: Diagnosis not present

## 2014-11-12 DIAGNOSIS — C799 Secondary malignant neoplasm of unspecified site: Secondary | ICD-10-CM | POA: Insufficient documentation

## 2014-11-12 DIAGNOSIS — Z9889 Other specified postprocedural states: Secondary | ICD-10-CM

## 2014-11-12 DIAGNOSIS — J90 Pleural effusion, not elsewhere classified: Secondary | ICD-10-CM

## 2014-11-12 DIAGNOSIS — Z510 Encounter for antineoplastic radiation therapy: Secondary | ICD-10-CM | POA: Diagnosis not present

## 2014-11-12 LAB — BODY FLUID CELL COUNT WITH DIFFERENTIAL
Lymphs, Fluid: 40 %
Monocyte-Macrophage-Serous Fluid: 37 % — ABNORMAL LOW (ref 50–90)
Neutrophil Count, Fluid: 23 % (ref 0–25)
Total Nucleated Cell Count, Fluid: 465 cu mm (ref 0–1000)

## 2014-11-12 LAB — LACTATE DEHYDROGENASE, PLEURAL OR PERITONEAL FLUID: LD FL: 240 U/L — AB (ref 3–23)

## 2014-11-12 LAB — PH, BODY FLUID: PH, FLUID: 7.5

## 2014-11-12 MED ORDER — OXYCODONE HCL 5 MG PO TABS: ORAL_TABLET | ORAL | Status: AC

## 2014-11-12 NOTE — Procedures (Signed)
US guided diagnostic/ therapeutic right thoracentesis performed yielding 550 cc's slightly turbid, yellow fluid. The fluid was sent to the lab for preordered studies. F/u CXR pending. No immediate complications.

## 2014-11-12 NOTE — Telephone Encounter (Signed)
Called patient home phone left voice message,was sorry that she missed her appt yesterday,hope she is doing better and to please call if unable to make  Her 1:45pm appt today  11:20 AM

## 2014-11-12 NOTE — Telephone Encounter (Signed)
Patient calling, c/o pain after biopsy today. Script for oxycodone left at front for patient p/u. Patient notified.

## 2014-11-13 ENCOUNTER — Ambulatory Visit: Payer: Medicare PPO

## 2014-11-14 ENCOUNTER — Telehealth: Payer: Self-pay | Admitting: *Deleted

## 2014-11-14 ENCOUNTER — Ambulatory Visit: Admission: RE | Admit: 2014-11-14 | Payer: Medicare PPO | Source: Ambulatory Visit

## 2014-11-14 NOTE — Telephone Encounter (Signed)
Patient called stating she can hardly walk and kept falling after Tuesday's procedure, thoracentesis, cancelled Wednesday's radiation treatment and todays, asked if Dr. Lisbeth Renshaw could admit her to the hospital?, Informed her that Dr. Lisbeth Renshaw doesn't have privileges to admit patients, she would need to call her Primary MD or Medical Oncologist, she asked to transfer her call to Dr. Alen Blew, transferred call to his Nurse phone =[phone], called Linac#1,spokw with [facility] to cancel today's treatment,will check later to see if patient is admitted, will in basket Dr. Lisbeth Renshaw as well 10:42 AM

## 2014-11-14 NOTE — Telephone Encounter (Signed)
Patient calling to request dr Alen Blew admit her for her last 7 radiation treatments, d/t she is so weak, and keeps falling. Per dr Alen Blew, if she feels she is unable to go to her radiation treatments, she should consider hospice care, that they previously discussed at her last visit. Patient verbalized understanding, but declined hospice services.

## 2014-11-15 ENCOUNTER — Telehealth: Payer: Self-pay | Admitting: *Deleted

## 2014-11-15 ENCOUNTER — Other Ambulatory Visit: Payer: Medicare HMO

## 2014-11-15 ENCOUNTER — Ambulatory Visit: Admission: RE | Admit: 2014-11-15 | Payer: Medicare PPO | Source: Ambulatory Visit

## 2014-11-15 ENCOUNTER — Ambulatory Visit: Admission: RE | Admit: 2014-11-15 | Payer: Medicare HMO | Source: Ambulatory Visit | Admitting: Radiation Oncology

## 2014-11-15 LAB — BODY FLUID CULTURE: CULTURE: NO GROWTH

## 2014-11-15 NOTE — Telephone Encounter (Signed)
Called  Patient at home,left voice message asking her status ,since cancelling 2 days in a row, if she was feeling better, and to ask for a call back ,Dr. Lisbeth Renshaw would like to see  her today and if a refill on nystatin is still needed, a faxed copy for this was sent to [place] 8:40 AM

## 2014-11-18 ENCOUNTER — Ambulatory Visit: Payer: Medicare HMO | Admitting: Radiation Oncology

## 2014-11-18 ENCOUNTER — Ambulatory Visit: Payer: Medicare PPO

## 2014-11-18 ENCOUNTER — Encounter: Payer: Self-pay | Admitting: Radiation Oncology

## 2014-11-18 ENCOUNTER — Telehealth: Payer: Self-pay | Admitting: *Deleted

## 2014-11-18 NOTE — Telephone Encounter (Signed)
Called patient home, spoke with ms,.Kristina Lawson, asked how she was feeling "I'm present and accounted for", asked if she had any more falls,recently, she stated"No", asked if she would be coming to have rad tx tomorrow," she has missed 3 treatments in a row, I'll come when Dr.Moody is here, informed her that would be Wednesday, 11/20/14, she wuill be here then asked her appt time, informed her it was scheduled for 1:45pm,she stated she would be her then Thanked me for calling and checking on her, she also stated she would never see  The Medical Oncologist again 3:23 PM

## 2014-11-19 ENCOUNTER — Ambulatory Visit: Payer: Medicare PPO

## 2014-11-20 ENCOUNTER — Ambulatory Visit: Payer: Medicare PPO

## 2014-11-20 ENCOUNTER — Ambulatory Visit: Payer: Medicare HMO | Admitting: Radiation Oncology

## 2014-11-20 ENCOUNTER — Ambulatory Visit: Payer: Medicare PPO | Admitting: Radiation Oncology

## 2014-11-21 ENCOUNTER — Ambulatory Visit: Payer: Medicare PPO

## 2014-11-21 ENCOUNTER — Telehealth: Payer: Self-pay | Admitting: *Deleted

## 2014-11-21 NOTE — Telephone Encounter (Signed)
Called [name] back, at [phone], straight to voice mail left message that patient's MRI has been rescheduled for 12/02/14, at 3pm, per Kristina Lawson, Mri has had to be changed and the card that he has(Kristina Lawson) should be discarded another card was sent in the mail yesterday, for any questions can call Manuela Schwartz [phone] 3:17 PM

## 2014-11-21 NOTE — Telephone Encounter (Signed)
[name], patient's son called and asked after patient gets her MRI tomorrow if they can come and speak with Dr.Moody,, Dr. Lisbeth Renshaw is in another office tomorrow and  I checked in EPIC and the MRI appt says it is  12/02/14, and also informed [name] I had spoken with Ms.Heumann on 11/18/13 and she was going to come in yesterday to speak with Dr.Moody, and didn't show, per [name], he got a phone call yesterday  saying MRI is for tomorrow, I will speak with Mont Dutton, Brain navigator and call him back, asked if patient could come in today or tomorrow, [name] wants to wait till after the next MRI, asked if Dr.MOody would call in the Nystatin, and I informed him,  unfortunately no,  unless he can  see the patient , also asked if I could give the results of the U/S report,informed him ,no as well, the Dr. Parks Ranger ordered the U?S should give him the results, will call patient back later today about the MRI scheule 2:31 PM

## 2014-11-22 ENCOUNTER — Ambulatory Visit: Payer: Medicare PPO

## 2014-11-22 ENCOUNTER — Other Ambulatory Visit: Payer: Medicare HMO

## 2014-11-23 ENCOUNTER — Ambulatory Visit: Payer: Medicare PPO

## 2014-11-24 ENCOUNTER — Inpatient Hospital Stay (HOSPITAL_COMMUNITY)
Admission: EM | Admit: 2014-11-24 | Discharge: 2014-12-09 | DRG: 871 | Disposition: E | Payer: Medicare PPO | Attending: Internal Medicine | Admitting: Internal Medicine

## 2014-11-24 ENCOUNTER — Emergency Department (HOSPITAL_COMMUNITY): Payer: Medicare PPO

## 2014-11-24 ENCOUNTER — Encounter (HOSPITAL_COMMUNITY): Payer: Self-pay | Admitting: *Deleted

## 2014-11-24 DIAGNOSIS — C7931 Secondary malignant neoplasm of brain: Secondary | ICD-10-CM | POA: Diagnosis present

## 2014-11-24 DIAGNOSIS — Z66 Do not resuscitate: Secondary | ICD-10-CM | POA: Diagnosis present

## 2014-11-24 DIAGNOSIS — Z87891 Personal history of nicotine dependence: Secondary | ICD-10-CM | POA: Diagnosis not present

## 2014-11-24 DIAGNOSIS — Z515 Encounter for palliative care: Secondary | ICD-10-CM

## 2014-11-24 DIAGNOSIS — Z8249 Family history of ischemic heart disease and other diseases of the circulatory system: Secondary | ICD-10-CM

## 2014-11-24 DIAGNOSIS — C7972 Secondary malignant neoplasm of left adrenal gland: Secondary | ICD-10-CM | POA: Diagnosis present

## 2014-11-24 DIAGNOSIS — R627 Adult failure to thrive: Secondary | ICD-10-CM | POA: Diagnosis present

## 2014-11-24 DIAGNOSIS — E872 Acidosis: Secondary | ICD-10-CM | POA: Diagnosis present

## 2014-11-24 DIAGNOSIS — Z923 Personal history of irradiation: Secondary | ICD-10-CM

## 2014-11-24 DIAGNOSIS — N179 Acute kidney failure, unspecified: Secondary | ICD-10-CM | POA: Diagnosis present

## 2014-11-24 DIAGNOSIS — K831 Obstruction of bile duct: Secondary | ICD-10-CM | POA: Diagnosis present

## 2014-11-24 DIAGNOSIS — A419 Sepsis, unspecified organism: Principal | ICD-10-CM | POA: Diagnosis present

## 2014-11-24 DIAGNOSIS — E785 Hyperlipidemia, unspecified: Secondary | ICD-10-CM | POA: Diagnosis present

## 2014-11-24 DIAGNOSIS — R0600 Dyspnea, unspecified: Secondary | ICD-10-CM

## 2014-11-24 DIAGNOSIS — N390 Urinary tract infection, site not specified: Secondary | ICD-10-CM | POA: Diagnosis present

## 2014-11-24 DIAGNOSIS — Z6830 Body mass index (BMI) 30.0-30.9, adult: Secondary | ICD-10-CM | POA: Diagnosis not present

## 2014-11-24 DIAGNOSIS — R591 Generalized enlarged lymph nodes: Secondary | ICD-10-CM | POA: Diagnosis present

## 2014-11-24 DIAGNOSIS — C3490 Malignant neoplasm of unspecified part of unspecified bronchus or lung: Secondary | ICD-10-CM | POA: Diagnosis present

## 2014-11-24 DIAGNOSIS — I10 Essential (primary) hypertension: Secondary | ICD-10-CM | POA: Diagnosis present

## 2014-11-24 DIAGNOSIS — E43 Unspecified severe protein-calorie malnutrition: Secondary | ICD-10-CM | POA: Diagnosis present

## 2014-11-24 DIAGNOSIS — Z79899 Other long term (current) drug therapy: Secondary | ICD-10-CM

## 2014-11-24 DIAGNOSIS — E860 Dehydration: Secondary | ICD-10-CM | POA: Diagnosis present

## 2014-11-24 DIAGNOSIS — K315 Obstruction of duodenum: Secondary | ICD-10-CM | POA: Diagnosis present

## 2014-11-24 DIAGNOSIS — C787 Secondary malignant neoplasm of liver and intrahepatic bile duct: Secondary | ICD-10-CM | POA: Diagnosis present

## 2014-11-24 DIAGNOSIS — E875 Hyperkalemia: Secondary | ICD-10-CM | POA: Diagnosis present

## 2014-11-24 DIAGNOSIS — G9341 Metabolic encephalopathy: Secondary | ICD-10-CM | POA: Diagnosis present

## 2014-11-24 DIAGNOSIS — J90 Pleural effusion, not elsewhere classified: Secondary | ICD-10-CM | POA: Diagnosis present

## 2014-11-24 DIAGNOSIS — R4182 Altered mental status, unspecified: Secondary | ICD-10-CM | POA: Diagnosis present

## 2014-11-24 DIAGNOSIS — Z7189 Other specified counseling: Secondary | ICD-10-CM

## 2014-11-24 DIAGNOSIS — D72829 Elevated white blood cell count, unspecified: Secondary | ICD-10-CM

## 2014-11-24 LAB — COMPREHENSIVE METABOLIC PANEL WITH GFR
ALT: 33 U/L (ref 0–35)
AST: 44 U/L — ABNORMAL HIGH (ref 0–37)
Albumin: 2 g/dL — ABNORMAL LOW (ref 3.5–5.2)
Alkaline Phosphatase: 251 U/L — ABNORMAL HIGH (ref 39–117)
Anion gap: 13 (ref 5–15)
BUN: 61 mg/dL — ABNORMAL HIGH (ref 6–23)
CO2: 20 mmol/L (ref 19–32)
Calcium: 8.1 mg/dL — ABNORMAL LOW (ref 8.4–10.5)
Chloride: 107 meq/L (ref 96–112)
Creatinine, Ser: 3.18 mg/dL — ABNORMAL HIGH (ref 0.50–1.10)
GFR calc Af Amer: 16 mL/min — ABNORMAL LOW
GFR calc non Af Amer: 14 mL/min — ABNORMAL LOW
Glucose, Bld: 129 mg/dL — ABNORMAL HIGH (ref 70–99)
Potassium: 5.3 mmol/L — ABNORMAL HIGH (ref 3.5–5.1)
Sodium: 140 mmol/L (ref 135–145)
Total Bilirubin: 3.1 mg/dL — ABNORMAL HIGH (ref 0.3–1.2)
Total Protein: 5.7 g/dL — ABNORMAL LOW (ref 6.0–8.3)

## 2014-11-24 LAB — PHOSPHORUS: Phosphorus: 4.8 mg/dL — ABNORMAL HIGH (ref 2.3–4.6)

## 2014-11-24 LAB — URINE MICROSCOPIC-ADD ON

## 2014-11-24 LAB — CBC
HCT: 33.9 % — ABNORMAL LOW (ref 36.0–46.0)
Hemoglobin: 10.8 g/dL — ABNORMAL LOW (ref 12.0–15.0)
MCH: 28.9 pg (ref 26.0–34.0)
MCHC: 31.9 g/dL (ref 30.0–36.0)
MCV: 90.6 fL (ref 78.0–100.0)
Platelets: 295 10*3/uL (ref 150–400)
RBC: 3.74 MIL/uL — ABNORMAL LOW (ref 3.87–5.11)
RDW: 19 % — ABNORMAL HIGH (ref 11.5–15.5)
WBC: 21.9 10*3/uL — ABNORMAL HIGH (ref 4.0–10.5)

## 2014-11-24 LAB — URINALYSIS, ROUTINE W REFLEX MICROSCOPIC
GLUCOSE, UA: NEGATIVE mg/dL
KETONES UR: NEGATIVE mg/dL
Nitrite: POSITIVE — AB
PH: 5 (ref 5.0–8.0)
Protein, ur: 30 mg/dL — AB
SPECIFIC GRAVITY, URINE: 1.02 (ref 1.005–1.030)
Urobilinogen, UA: 1 mg/dL (ref 0.0–1.0)

## 2014-11-24 LAB — CBG MONITORING, ED: Glucose-Capillary: 100 mg/dL — ABNORMAL HIGH (ref 70–99)

## 2014-11-24 LAB — MAGNESIUM: Magnesium: 2.3 mg/dL (ref 1.5–2.5)

## 2014-11-24 LAB — I-STAT CG4 LACTIC ACID, ED: Lactic Acid, Venous: 3.6 mmol/L — ABNORMAL HIGH (ref 0.5–2.2)

## 2014-11-24 MED ORDER — CIPROFLOXACIN IN D5W 400 MG/200ML IV SOLN
400.0000 mg | INTRAVENOUS | Status: AC
  Administered 2014-11-24: 400 mg via INTRAVENOUS
  Filled 2014-11-24: qty 200

## 2014-11-24 MED ORDER — OXYCODONE HCL 5 MG PO TABS: 5.0000 mg | ORAL_TABLET | ORAL | Status: DC | PRN

## 2014-11-24 MED ORDER — ONDANSETRON HCL 4 MG PO TABS: 4.0000 mg | ORAL_TABLET | Freq: Four times a day (QID) | ORAL | Status: DC | PRN

## 2014-11-24 MED ORDER — HEPARIN SODIUM (PORCINE) 5000 UNIT/ML IJ SOLN
5000.0000 [IU] | Freq: Three times a day (TID) | INTRAMUSCULAR | Status: DC
  Administered 2014-11-24: 5000 [IU] via SUBCUTANEOUS
  Filled 2014-11-24 (×5): qty 1

## 2014-11-24 MED ORDER — SODIUM CHLORIDE 0.9 % IV BOLUS (SEPSIS): 1000.0000 mL | Freq: Once | INTRAVENOUS | Status: AC

## 2014-11-24 MED ORDER — ONDANSETRON HCL 4 MG/2ML IJ SOLN: 4.0000 mg | Freq: Four times a day (QID) | INTRAMUSCULAR | Status: DC | PRN

## 2014-11-24 MED ORDER — SODIUM CHLORIDE 0.9 % IV SOLN
INTRAVENOUS | Status: DC
  Administered 2014-11-24: 21:00:00 via INTRAVENOUS

## 2014-11-24 MED ORDER — SODIUM CHLORIDE 0.9 % IV SOLN
INTRAVENOUS | Status: DC
  Administered 2014-11-24: 15:00:00 via INTRAVENOUS

## 2014-11-24 MED ORDER — SODIUM CHLORIDE 0.9 % IV SOLN
INTRAVENOUS | Status: AC
  Administered 2014-11-24: 21:00:00 via INTRAVENOUS

## 2014-11-24 MED ORDER — BISOPROLOL FUMARATE 5 MG PO TABS
5.0000 mg | ORAL_TABLET | Freq: Every day | ORAL | Status: DC
  Filled 2014-11-24: qty 1

## 2014-11-24 MED ORDER — DEXTROSE 5 % IV SOLN: 1.0000 g | Freq: Once | INTRAVENOUS | Status: DC

## 2014-11-24 MED ORDER — CIPROFLOXACIN IN D5W 400 MG/200ML IV SOLN
400.0000 mg | INTRAVENOUS | Status: DC
  Administered 2014-11-25 – 2014-11-27 (×3): 400 mg via INTRAVENOUS
  Filled 2014-11-24 (×3): qty 200

## 2014-11-24 NOTE — ED Notes (Signed)
Dr. Wendee Beavers phoned and clarified that pt. Does not need telemetry monitoring--order d/c'd. And will proceed with transport to med-surg.

## 2014-11-24 NOTE — ED Notes (Signed)
Pt arrived via [facility], pt has had AMS since early Friday AM. Loose stools and incont have developed. Pt has history of Lung Ca with Mets to brain. 2L 02 for comfort. 118/76-112-14 CBG 108.

## 2014-11-24 NOTE — H&P (Signed)
[facility] History and Physical  Kristina Lawson OBS:[id number] DOB: [birth year] DOA: 11/10/2014  Referring physician: Dr. Vanita Panda PCP: [name], MD   Chief Complaint: poor oral intake for 2 weeks and weakness  HPI: Kristina Lawson is a 69 y.o. female  With history of metastatic lung cancer advanced per my discussion with ER physician. Presents to the hospital after developing failure to thrive and poor oral intake for 2 weeks. Her weakness has been getting worse since onset. Also states that patient has had increased frequency urination. Because symptoms were persistent and getting worse patient presented to the ED for further evaluation. While in house was found to have an elevated white blood cell count of 21,000, and elevated potassium level of 5.3, and elevated BUN and creatinine. Urinalysis was done and showed brown cloudy urine which was positive for nitrite and leukocyte Estrace.  We are consulted for further medical evaluation recommendations. Much of the history is obtained from the family as patient is too weak to provide history.   Review of Systems:  Unable to assess as patient is weak and has limited response to examiner.  Past Medical History  Diagnosis Date  . Hypertension   . Smoker   . Hyperlipidemia   . Brain cancer 06/17/14    multiple brain metastses  . 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 Seizures 06/17/14    witness at home brought to the ED  . S/P radiation therapy 07/16/14-07/31/14/palliative    brain,left base&infratemporal .chest/meiastinum,proxiaml rt humerus   Past Surgical History  Procedure Laterality Date  . Ct biopsy  06/19/14    Left adrenal mass  . Ercp N/A 10/31/2014    Procedure: ENDOSCOPIC RETROGRADE CHOLANGIOPANCREATOGRAPHY (ERCP);  Surgeon: Inda Castle, MD;  Location: [facility];  Service: Endoscopy;  Laterality: N/A;  . Endoscopic retrograde cholangiopancreatography (ercp) with  propofol N/A 10/31/2014    Procedure: ENDOSCOPIC RETROGRADE CHOLANGIOPANCREATOGRAPHY (ERCP) WITH PROPOFOL;  Surgeon: Inda Castle, MD;  Location: [facility];  Service: Endoscopy;  Laterality: N/A;   Social History:  reports that she quit smoking about 4 months ago. Her smoking use included Cigarettes. She has a 45 pack-year smoking history. She has never used smokeless tobacco. She reports that she drinks alcohol. She reports that she does not use illicit drugs.  Allergies  Allergen Reactions  . Penicillins Other (See Comments)    unknown    Family History  Problem Relation Age of Onset  . Heart disease Mother      Prior to Admission medications   Medication Sig Start Date End Date Taking? Authorizing Provider  bisoprolol (ZEBETA) 5 MG tablet Take 1 tablet (5 mg total) by mouth daily. 07/23/14  Yes Susy Frizzle, MD  metoCLOPramide (REGLAN) 10 MG tablet Take 1 tablet (10 mg total) by mouth 4 (four) times daily. Patient taking differently: Take 10 mg by mouth daily as needed for nausea or vomiting.  10/09/14  Yes Jodelle Gross, MD  oxyCODONE (OXY IR/ROXICODONE) 5 MG immediate release tablet 1-2 tablets every 4 hours prn pain 11/12/14  Yes Wyatt Portela, MD  prochlorperazine (COMPAZINE) 10 MG tablet Take 1 tablet (10 mg total) by mouth every 6 (six) hours as needed for nausea or vomiting. 10/25/14  Yes Jodelle Gross, MD  nystatin (MYCOSTATIN) 100000 UNIT/ML suspension Take 5 mLs (500,000 Units total) by mouth 4 (four) times daily. Patient not taking: Reported on 11/08/2014 10/09/14   Jodelle Gross, MD  Physical Exam: Filed Vitals:   11/30/2014 1436 11/09/2014 1436 12/05/2014 1548 11/12/2014 1637  BP: 104/68  97/68 108/66  Pulse: 109  107 105  Temp:      TempSrc:      Resp: 14  29   SpO2: 93% 97% 95% 94%    Wt Readings from Last 3 Encounters:  10/29/14 63.141 kg (139 lb 3.2 oz)  08/08/14 71.85 kg (158 lb 6.4 oz)  07/31/14 75.66 kg (166 lb 12.8 oz)    General:  Alert and awake in no  acute distress Eyes: PERRL, normal lids, irises & conjunctiva ENT: grossly normal hearing, lips & tongue, dry mucous membranes Neck: no LAD, masses or thyromegaly Cardiovascular: RRR, no m/r/g. No LE edema. Respiratory: CTA bilaterally, no w/r/r. Normal respiratory effort. Abdomen: generalized tenderness with palpation, positive suprapubic discomfort Skin: no rash or induration seen on limited exam Musculoskeletal: grossly normal tone BUE/BLE Psychiatric: unable to accurately assess due to limited interaction with examiner Neurologic: grossly non-focal.          Labs on Admission:  Basic Metabolic Panel:  Recent Labs Lab 12/02/2014 1423  NA 140  K 5.3*  CL 107  CO2 20  GLUCOSE 129*  BUN 61*  CREATININE 3.18*  CALCIUM 8.1*   Liver Function Tests:  Recent Labs Lab 11/20/2014 1423  AST 44*  ALT 33  ALKPHOS 251*  BILITOT 3.1*  PROT 5.7*  ALBUMIN 2.0*   No results for input(s): LIPASE, AMYLASE in the last 168 hours. No results for input(s): AMMONIA in the last 168 hours. CBC:  Recent Labs Lab 12/07/2014 1423  WBC 21.9*  HGB 10.8*  HCT 33.9*  MCV 90.6  PLT 295   Cardiac Enzymes: No results for input(s): CKTOTAL, CKMB, CKMBINDEX, TROPONINI in the last 168 hours.  BNP (last 3 results) No results for input(s): PROBNP in the last 8760 hours. CBG:  Recent Labs Lab 11/16/2014 1413  GLUCAP 100*    Radiological Exams on Admission: Dg Chest 2 View  12/02/2014   CLINICAL DATA:  Shortness of breath today. Altered mental status since Friday. Hypertension. History of lung cancer.  EXAM: CHEST  2 VIEW  COMPARISON:  11/12/2014  FINDINGS: Moderate to large right pleural effusion, slightly increased since prior study. Increasing right lung airspace disease. No confluent opacity on the left. Heart is borderline enlarged. Mediastinal contours are within normal limits.  Old scratch head deformity of the right humeral head related to old healed injury, stable.  IMPRESSION: Enlarging  moderate to large right pleural effusion and worsening right lung airspace disease.   Electronically Signed   By: Rolm Baptise M.D.   On: 12/08/2014 15:13   Ct Head Wo Contrast  12/06/2014   CLINICAL DATA:  Increase in AMS over past 3 days. History of brain mets. Nurse note: pt has had AMS since early Friday AM. Loose stools and incont have developed. Pt has history of Lung Ca with Mets to brain.  EXAM: CT HEAD WITHOUT CONTRAST  TECHNIQUE: Contiguous axial images were obtained from the base of the skull through the vertex without intravenous contrast.  COMPARISON:  Head CT dated 06/17/2014 and brain MR dated 06/25/2014.  FINDINGS: Diffusely enlarged ventricles and subarachnoid spaces. Patchy white matter low density in both cerebral hemispheres. No intracranial hemorrhage, mass lesion or CT evidence of acute infarction. Unremarkable bones and included paranasal sinuses. The previously demonstrated white matter edema in the right parieto-occipital region is no longer seen. There is a small area of residual, more  focal low density in that region in the white matter.  IMPRESSION: 1. No acute abnormality. 2. Resolved edema associated with a previously demonstrated metastatic lesion in the right occipital lobe. 3. The other metastases seen on MR are not visible on today's CT examination. 4. Progressive diffuse cerebral atrophy with interval chronic small vessel white matter ischemic or postradiation changes.   Electronically Signed   By: Enrique Sack M.D.   On: 12/02/2014 14:56    EKG: Independently reviewed. Sinus tachycardia with no ST elevations or depressions some artifact  Assessment/Plan Active Problems:     Acute kidney injury - Most likely due to reported history of poor oral intake. - Plan will be to infuse normal saline overnight and reassess serum creatinine levels next a.m. - Should there be no improvement of serum creatinine despite IV fluid rehydration will plan on further  workup  Dehydration - As mentioned above secondary to poor oral intake. IV fluid rehydration and reassessment - Liberalize diet   leukocytosis  - In context of patient with metastatic lung cancer. The plan will be to administer IV antibiotics Cipro. Suspect source most likely urinary. Will follow-up with urine culture   UTI - Treat with Cipro and follow-up with urine culture  Hyperkalemia - We'll place on IV fluids and reassess next a.m.  Brain metastasis -We'll seek palliative care consult while in house    HTN (hypertension) -Will hold blood pressure medications on admission. We'll consider continuing next a.m. but will place holding orders should blood pressures remain soft    Lung cancer - Has metastasized and most likely causing failure to thrive  - Patient DO NOT RESUSCITATE     Protein-calorie malnutrition, severe - We'll consult RD - Liberalize diet    Altered mental status -Most likely secondary to dehydration and metastatic lung cancer    Code Status: DNR DVT Prophylaxis:  Heparin Family Communication: discussed with family at bedside Disposition Plan:  To be determined. Will recommend palliative care consult in am  Time spent: > 45 minutes  Velvet Bathe [facility] Pager [phone]

## 2014-11-24 NOTE — Progress Notes (Signed)
ANTIBIOTIC CONSULT NOTE - INITIAL  Pharmacy Consult for Ciprofloxacin Indication: UTI  Allergies  Allergen Reactions  . Penicillins Other (See Comments)    unknown    Patient Measurements:    Vital Signs: Temp: 99.2 F (37.3 C) (01/17 1404) Temp Source: Rectal (01/17 1404) BP: 108/66 mmHg (01/17 1637) Pulse Rate: 105 (01/17 1637) Intake/Output from previous day:   Intake/Output from this shift:    Labs:  Recent Labs  11/13/2014 1423  WBC 21.9*  HGB 10.8*  PLT 295  CREATININE 3.18*   CrCl cannot be calculated (Unknown ideal weight.). No results for input(s): VANCOTROUGH, VANCOPEAK, VANCORANDOM, GENTTROUGH, GENTPEAK, GENTRANDOM, TOBRATROUGH, TOBRAPEAK, TOBRARND, AMIKACINPEAK, AMIKACINTROU, AMIKACIN in the last 72 hours.   Microbiology: Recent Results (from the past 720 hour(s))  Body fluid culture     Status: None   Collection Time: 11/12/14 12:21 PM  Result Value Ref Range Status   Specimen Description PLEURAL RIGHT  Final   Special Requests NONE  Final   Gram Stain   Final    RARE WBC PRESENT,BOTH PMN AND MONONUCLEAR NO ORGANISMS SEEN Performed at [facility]    Culture   Final    NO GROWTH 3 DAYS Performed at [facility]    Report Status 11/15/2014 FINAL  Final    Medical History: Past Medical History  Diagnosis Date  . Hypertension   . Smoker   . Hyperlipidemia   . Brain cancer 06/17/14    multiple brain metastses  . 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 Seizures 06/17/14    witness at home brought to the ED  . S/P radiation therapy 07/16/14-07/31/14/palliative    brain,left base&infratemporal .chest/meiastinum,proxiaml rt humerus    Medications:   (Not in a hospital admission)  Assessment: 25 y female with metastatic lung cancer resents to ED with altered mental status. Urinalysis consistent with infection, Pharmacy consulted to dose ciprofloxacin (PCN allergic for presumed UTI.  1/17  >> ciprofloxacin >>  Tmax: 99.2 WBC: 21.9k Renal: SCr 3.18, CrCl 20 ml/min (normalized) Lactate: 3.6  1/17 urine cx: sent  Goal of Therapy:  Eradication of infection Dose per renal function  Plan:   Ciprofloxacin 400mg  IV q24h Monitor renal function and adjust as needed Follow up cultures and adjust therapy as appropriate  Peggyann Juba, PharmD, BCPS Pager: [phone] 12/02/2014,5:55 PM

## 2014-11-24 NOTE — ED Provider Notes (Signed)
CSN: [id number]     Arrival date & time 11/18/2014  1357 History   First MD Initiated Contact with Patient 11/26/2014 1501     Chief Complaint  Patient presents with  . Altered Mental Status      HPI  Patient presents with family members who provide history of present illness. Level V caveat secondary to altered mental status. They note that over the past 2 days specifically, but over the past 2 weeks in general the patient has had substantially decreased interactivity, appetite, awake state. Patient has history of metastatic lung cancer with known metastases to brain, liver, adrenal glands. Patient last had chemotherapy last month, has been told by oncology that she is no longer a candidate for additional therapeutic interventions. Patient has been receiving intermittent palliative radiation, last one week ago. Family states that over the past 2 days, as the patient has been increasingly sleepy, there has been no new vomiting, fever, cough, but the aforementioned complaints are concerning.   Past Medical History  Diagnosis Date  . Hypertension   . Smoker   . Hyperlipidemia   . Brain cancer 06/17/14    multiple brain metastses  . 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 Seizures 06/17/14    witness at home brought to the ED  . S/P radiation therapy 07/16/14-07/31/14/palliative    brain,left base&infratemporal .chest/meiastinum,proxiaml rt humerus   Past Surgical History  Procedure Laterality Date  . Ct biopsy  06/19/14    Left adrenal mass  . Ercp N/A 10/31/2014    Procedure: ENDOSCOPIC RETROGRADE CHOLANGIOPANCREATOGRAPHY (ERCP);  Surgeon: Inda Castle, MD;  Location: [facility];  Service: Endoscopy;  Laterality: N/A;  . Endoscopic retrograde cholangiopancreatography (ercp) with propofol N/A 10/31/2014    Procedure: ENDOSCOPIC RETROGRADE CHOLANGIOPANCREATOGRAPHY (ERCP) WITH PROPOFOL;  Surgeon: Inda Castle, MD;  Location: [facility];   Service: Endoscopy;  Laterality: N/A;   Family History  Problem Relation Age of Onset  . Heart disease Mother    History  Substance Use Topics  . Smoking status: Former Smoker -- 1.00 packs/day for 45 years    Types: Cigarettes    Quit date: 06/29/2014  . Smokeless tobacco: Never Used  . Alcohol Use: Yes     Comment: Occasional Wine   OB History    No data available     Review of Systems  Unable to perform ROS: Mental status change      Allergies  Penicillins  Home Medications   Prior to Admission medications   Medication Sig Start Date End Date Taking? Authorizing Provider  bisoprolol (ZEBETA) 5 MG tablet Take 1 tablet (5 mg total) by mouth daily. 07/23/14  Yes Susy Frizzle, MD  metoCLOPramide (REGLAN) 10 MG tablet Take 1 tablet (10 mg total) by mouth 4 (four) times daily. Patient taking differently: Take 10 mg by mouth daily as needed for nausea or vomiting.  10/09/14  Yes Jodelle Gross, MD  oxyCODONE (OXY IR/ROXICODONE) 5 MG immediate release tablet 1-2 tablets every 4 hours prn pain 11/12/14  Yes Wyatt Portela, MD  prochlorperazine (COMPAZINE) 10 MG tablet Take 1 tablet (10 mg total) by mouth every 6 (six) hours as needed for nausea or vomiting. 10/25/14  Yes Jodelle Gross, MD  nystatin (MYCOSTATIN) 100000 UNIT/ML suspension Take 5 mLs (500,000 Units total) by mouth 4 (four) times daily. Patient not taking: Reported on 11/29/2014 10/09/14   Jodelle Gross, MD   BP 104/68  mmHg  Pulse 109  Temp(Src) 99.2 F (37.3 C) (Rectal)  Resp 14  SpO2 97% Physical Exam  Constitutional: She appears well-developed and well-nourished. She appears listless. No distress.  Noninteractive, old, female sleeping in bed  HENT:  Head: Normocephalic and atraumatic.  Neck: No tracheal deviation present.  Cardiovascular: Regular rhythm.  Tachycardia present.   Pulmonary/Chest: No stridor. She has decreased breath sounds.  Abdominal: She exhibits no distension.  Musculoskeletal: She exhibits  no edema.  Neurological: She appears listless.  No facial asym Patient is essentially non-interactive. She awakens to stimuli only.  She MAES, slowly  Skin: Skin is warm and dry.  Psychiatric: She has a normal mood and affect. Her speech is delayed. Cognition and memory are impaired. She is noncommunicative.  Nursing note and vitals reviewed.   ED Course  Procedures (including critical care time) Labs Review Labs Reviewed  CBC - Abnormal; Notable for the following:    WBC 21.9 (*)    RBC 3.74 (*)    Hemoglobin 10.8 (*)    HCT 33.9 (*)    RDW 19.0 (*)    All other components within normal limits  COMPREHENSIVE METABOLIC PANEL - Abnormal; Notable for the following:    Potassium 5.3 (*)    Glucose, Bld 129 (*)    BUN 61 (*)    Creatinine, Ser 3.18 (*)    Calcium 8.1 (*)    Total Protein 5.7 (*)    Albumin 2.0 (*)    AST 44 (*)    Alkaline Phosphatase 251 (*)    Total Bilirubin 3.1 (*)    GFR calc non Af Amer 14 (*)    GFR calc Af Amer 16 (*)    All other components within normal limits  CBG MONITORING, ED - Abnormal; Notable for the following:    Glucose-Capillary 100 (*)    All other components within normal limits  I-STAT CG4 LACTIC ACID, ED - Abnormal; Notable for the following:    Lactic Acid, Venous 3.60 (*)    All other components within normal limits  URINALYSIS, ROUTINE W REFLEX MICROSCOPIC    Imaging Review Dg Chest 2 View  11/28/2014   CLINICAL DATA:  Shortness of breath today. Altered mental status since Friday. Hypertension. History of lung cancer.  EXAM: CHEST  2 VIEW  COMPARISON:  11/12/2014  FINDINGS: Moderate to large right pleural effusion, slightly increased since prior study. Increasing right lung airspace disease. No confluent opacity on the left. Heart is borderline enlarged. Mediastinal contours are within normal limits.  Old scratch head deformity of the right humeral head related to old healed injury, stable.  IMPRESSION: Enlarging moderate to large  right pleural effusion and worsening right lung airspace disease.   Electronically Signed   By: Rolm Baptise M.D.   On: 11/30/2014 15:13   Ct Head Wo Contrast  11/12/2014   CLINICAL DATA:  Increase in AMS over past 3 days. History of brain mets. Nurse note: pt has had AMS since early Friday AM. Loose stools and incont have developed. Pt has history of Lung Ca with Mets to brain.  EXAM: CT HEAD WITHOUT CONTRAST  TECHNIQUE: Contiguous axial images were obtained from the base of the skull through the vertex without intravenous contrast.  COMPARISON:  Head CT dated 06/17/2014 and brain MR dated 06/25/2014.  FINDINGS: Diffusely enlarged ventricles and subarachnoid spaces. Patchy white matter low density in both cerebral hemispheres. No intracranial hemorrhage, mass lesion or CT evidence of acute infarction. Unremarkable bones  and included paranasal sinuses. The previously demonstrated white matter edema in the right parieto-occipital region is no longer seen. There is a small area of residual, more focal low density in that region in the white matter.  IMPRESSION: 1. No acute abnormality. 2. Resolved edema associated with a previously demonstrated metastatic lesion in the right occipital lobe. 3. The other metastases seen on MR are not visible on today's CT examination. 4. Progressive diffuse cerebral atrophy with interval chronic small vessel white matter ischemic or postradiation changes.   Electronically Signed   By: Enrique Sack M.D.   On: 12/06/2014 14:56     EKG Interpretation   Date/Time:  Sunday November 24 2014 14:12:24 EST Ventricular Rate:  118 PR Interval:  131 QRS Duration: 68 QT Interval:  297 QTC Calculation: 416 R Axis:   44 Text Interpretation:  Sinus tachycardia Nonspecific T abnormalities,  lateral leads No significant change since last tracing Confirmed by WARD,   DO, KRISTEN ([id number]) on 12/05/2014 2:15:13 PM     I reviewed the EMR.   3:37 PM Labs notable for ARF, hyperkalemia,  lactic acidosis. IVF had started empirically, and she will continue to receive fluid resuscitation.  XR notable for recurrent pleural effusion.  5:25 PM I had multiple lengthy conversations with the patient's son, family members. The outer family members all seem to understand the patient's condition, prognosis. The patient's son voices an understanding as well, though seems to be taking additional time to reconcile himself with his mother's condition. However, he seems to agree to make patient DO NOT RESUSCITATE.  Update: Though the patient is listless, she does have brief verbal responses, follows commands, slowly. I explained the patient's lab abnormalities today, including acute kidney failure. Patient voices an understanding of DO NOT RESUSCITATE status and agrees with this designation.   MDM     Final diagnoses:  Altered mental status   acute kidney failure Urinary tract infection Encephalopathy    This patient with metastatic lung cancer presents with altered mental status.  On exam patient is clinically encephalopathic, listless, but appears to be in no distress, in no pain. Review of the chart abstract the patient has extensive progression of her metastatic disease, and today the patient was found to have new acute kidney failure, recurrence of pleural effusion, and urinary tract infection. After multiple lengthy conversations with the patient, family, the patient was made DO NOT RESUSCITATE.  Patient was admitted with anticipation of palliative care consult, hospice evaluation.   Carmin Muskrat, MD 12/08/2014 1729

## 2014-11-24 NOTE — ED Notes (Signed)
Dr. Vanita Panda has spent much time with pt. And her family, including her son regarding their desires as to dnr/hospice/comfort care, etc.  They are still contemplating these issues.  Her son seems to be having difficulty processing this.

## 2014-11-24 NOTE — ED Notes (Signed)
I have just given report to Lovena Le, RN on [address]--will transport shortly.

## 2014-11-24 NOTE — ED Notes (Signed)
Bed: DP32 Expected date: 12/07/2014 Expected time: 1:59 PM Means of arrival: Ambulance Comments: AMS

## 2014-11-25 ENCOUNTER — Ambulatory Visit: Payer: Medicare HMO | Admitting: Radiation Oncology

## 2014-11-25 ENCOUNTER — Ambulatory Visit: Payer: Medicare PPO

## 2014-11-25 DIAGNOSIS — R0600 Dyspnea, unspecified: Secondary | ICD-10-CM

## 2014-11-25 DIAGNOSIS — Z515 Encounter for palliative care: Secondary | ICD-10-CM

## 2014-11-25 DIAGNOSIS — N179 Acute kidney failure, unspecified: Secondary | ICD-10-CM

## 2014-11-25 DIAGNOSIS — C7931 Secondary malignant neoplasm of brain: Secondary | ICD-10-CM

## 2014-11-25 DIAGNOSIS — Z7189 Other specified counseling: Secondary | ICD-10-CM

## 2014-11-25 DIAGNOSIS — G9340 Encephalopathy, unspecified: Secondary | ICD-10-CM

## 2014-11-25 DIAGNOSIS — C3492 Malignant neoplasm of unspecified part of left bronchus or lung: Secondary | ICD-10-CM

## 2014-11-25 LAB — CBC
HCT: 31.4 % — ABNORMAL LOW (ref 36.0–46.0)
Hemoglobin: 10 g/dL — ABNORMAL LOW (ref 12.0–15.0)
MCH: 28.7 pg (ref 26.0–34.0)
MCHC: 31.8 g/dL (ref 30.0–36.0)
MCV: 90.2 fL (ref 78.0–100.0)
Platelets: 236 10*3/uL (ref 150–400)
RBC: 3.48 MIL/uL — AB (ref 3.87–5.11)
RDW: 19.1 % — ABNORMAL HIGH (ref 11.5–15.5)
WBC: 15.2 10*3/uL — ABNORMAL HIGH (ref 4.0–10.5)

## 2014-11-25 LAB — CLOSTRIDIUM DIFFICILE BY PCR: Toxigenic C. Difficile by PCR: NEGATIVE

## 2014-11-25 LAB — BASIC METABOLIC PANEL
ANION GAP: 18 — AB (ref 5–15)
BUN: 65 mg/dL — ABNORMAL HIGH (ref 6–23)
CO2: 16 mmol/L — AB (ref 19–32)
Calcium: 7.3 mg/dL — ABNORMAL LOW (ref 8.4–10.5)
Chloride: 109 mEq/L (ref 96–112)
Creatinine, Ser: 3.24 mg/dL — ABNORMAL HIGH (ref 0.50–1.10)
GFR calc Af Amer: 16 mL/min — ABNORMAL LOW (ref >90)
GFR, EST NON AFRICAN AMERICAN: 14 mL/min — AB (ref >90)
Glucose, Bld: 104 mg/dL — ABNORMAL HIGH (ref 70–99)
Potassium: 6 mmol/L — ABNORMAL HIGH (ref 3.5–5.1)
Sodium: 143 mmol/L (ref 135–145)

## 2014-11-25 MED ORDER — MORPHINE SULFATE 2 MG/ML IJ SOLN: 1.0000 mg | INTRAMUSCULAR | Status: DC | PRN

## 2014-11-25 MED ORDER — MORPHINE SULFATE 2 MG/ML IJ SOLN
1.0000 mg | INTRAMUSCULAR | Status: DC | PRN
  Administered 2014-11-25 – 2014-11-27 (×6): 1 mg via INTRAVENOUS
  Filled 2014-11-25 (×8): qty 1

## 2014-11-25 MED ORDER — SODIUM POLYSTYRENE SULFONATE 15 GM/60ML PO SUSP
60.0000 g | Freq: Once | ORAL | Status: DC
  Filled 2014-11-25: qty 240

## 2014-11-25 MED ORDER — SODIUM CHLORIDE 0.9 % IV BOLUS (SEPSIS)
250.0000 mL | Freq: Once | INTRAVENOUS | Status: AC
  Administered 2014-11-25: 250 mL via INTRAVENOUS

## 2014-11-25 NOTE — Progress Notes (Signed)
Pt with 3 or more diarrhea stools in last 24 hrs, tenderness of abd and distention.  C. Diff specimen sent and pt placed on enteric/ contact precautions per protocol.

## 2014-11-25 NOTE — Consult Note (Signed)
Patient FB:Kristina Lawson      DOB: 03/27/1946      NID:[id number]     Consult Note from the Palliative Medicine Team at [facility] Requested by: Dr Maryland Pink     PCP: [name], MD Reason for Consultation: Clarification of [place] and options    Phone Number:[phone]  Assessment of patients Current state:  Continued physical and functional and cognitive decline 2/2 to metastatic small cell lung cancer, now transitioning at EOL and family struggles with comfort vs aggressive medical decisions  Consult is for review of medical treatment options, clarification of goals of care and end of life issues, disposition and options, and symptom recommendation.  This NP Wadie Lessen reviewed medical records, received report from team, assessed the patient and then meet at the patient's bedside along with her son Nemiah Commander, sister and Jason's father  to discuss diagnosis prognosis, GOC, EOL wishes disposition and options.  A detailed discussion was had today regarding advanced directives.  Concepts specific to code status, artifical  hydration, continued IV antibiotics and rehospitalization was had.  The difference between a aggressive medical intervention path  and a palliative comfort care path for this patient at this time was had.  Values and goals of care important to patient and family were attempted to be elicited.  Concept of Hospice and Palliative Care were discussed  Natural trajectory and expectations at EOL were discussed.  Questions and concerns addressed.  Hard Choices booklet left for review. Family encouraged to call with questions or concerns.  PMT will continue to support holistically.   Goals of Care: 1.  Code Status:  DNR/DNI-comfort is main focus of care  -it has been very difficult for Kristina Lawson to make any de-escalation decisions, he does not "want to have any regrets".  Education regarding the natural trajectory and expectations at EOL.  He is aware that  her death at any time would not be unexpected.   2. Scope of Treatment: 1. Vital Signs: per shift  2. Respiratory/Oxygen: as needed for comfort 3. Antibiotics:continue for now 4. IVF: finish this bag of fluids and then decrease to 75 cc hr 5. Labs: discontinue   3. Disposition:  Pending outcomes, may need to discuss hospice facility   4. Symptom Management:    1. Pain: Morphine 1 mg every 2 hrs prn  5. Psychosocial:  Emotional support offered to family at bedside  6. Spiritual:  Strong faith based family.  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  Transitioning at EOL, prognosis is likely hrs to days   ROS: unable to illicit due to decreased cognition   PMH:  Past Medical History  Diagnosis Date  . Hypertension   . Smoker   . Hyperlipidemia   . Brain cancer 06/17/14    multiple brain metastses  . 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 Seizures 06/17/14    witness at home brought to the ED  . S/P radiation therapy 07/16/14-07/31/14/palliative    brain,left base&infratemporal .chest/meiastinum,proxiaml rt humerus     PSH: Past Surgical History  Procedure Laterality Date  .  Ct biopsy  06/19/14    Left adrenal mass  . Ercp N/A 10/31/2014    Procedure: ENDOSCOPIC RETROGRADE CHOLANGIOPANCREATOGRAPHY (ERCP);  Surgeon: Inda Castle, MD;  Location: [facility];  Service: Endoscopy;  Laterality: N/A;  . Endoscopic retrograde cholangiopancreatography (ercp) with propofol N/A 10/31/2014    Procedure: ENDOSCOPIC RETROGRADE CHOLANGIOPANCREATOGRAPHY (ERCP) WITH PROPOFOL;  Surgeon: Inda Castle, MD;  Location: [facility];  Service: Endoscopy;  Laterality: N/A;    I have reviewed the [place] and SH and  If appropriate update it with new information. Allergies  Allergen Reactions  . Penicillins Other (See Comments)    unknown   Scheduled Meds: . ciprofloxacin  400 mg Intravenous Q24H  . heparin  5,000 Units Subcutaneous 3 times per day  . sodium polystyrene  60 g Rectal Once   Continuous Infusions: . sodium chloride 150 mL/hr at 12/03/2014 2030   PRN Meds:.morphine injection, ondansetron **OR** ondansetron (ZOFRAN) IV, oxyCODONE    BP 111/67 mmHg  Pulse 105  Temp(Src) 98.5 F (36.9 C) (Axillary)  Resp 24  SpO2 100%   PPS: 20 % at best   Intake/Output Summary (Last 24 hours) at 11/25/14 1111 Last data filed at 11/25/14 3299  Gross per 24 hour  Intake   1500 ml  Output      0 ml  Net   1500 ml    Physical Exam:  General:  Open eyes to name, unable to communicate verbally HEENT:  Moist buccal membranes Chest:   Decreased in bases CVS: tachycardic  Labs: CBC    Component Value Date/Time   WBC 15.2* 11/25/2014 0750   RBC 3.48* 11/25/2014 0750   HGB 10.0* 11/25/2014 0750   HCT 31.4* 11/25/2014 0750   PLT 236 11/25/2014 0750   MCV 90.2 11/25/2014 0750   MCH 28.7 11/25/2014 0750   MCHC 31.8 11/25/2014 0750   RDW 19.1* 11/25/2014 0750   LYMPHSABS 0.6* 10/25/2014 1515   MONOABS 1.1* 10/25/2014 1515   EOSABS 0.1 10/25/2014 1515   BASOSABS 0.0 10/25/2014 1515    BMET    Component Value Date/Time   NA 143 11/25/2014 0514   K 6.0* 11/25/2014 0514   CL 109 11/25/2014 0514   CO2 16* 11/25/2014 0514   GLUCOSE 104* 11/25/2014 0514   BUN 65* 11/25/2014 0514   BUN 8.2 10/11/2014 1614   CREATININE 3.24* 11/25/2014 0514   CREATININE 0.8 10/11/2014 1614   CREATININE 0.79 02/06/2014 0908   CALCIUM 7.3* 11/25/2014 0514   GFRNONAA 14* 11/25/2014 0514   GFRNONAA 79 02/06/2014 0908   GFRAA 16* 11/25/2014 0514   GFRAA >89 02/06/2014 0908    CMP     Component Value Date/Time   NA 143 11/25/2014 0514   K 6.0* 11/25/2014 0514    CL 109 11/25/2014 0514   CO2 16* 11/25/2014 0514   GLUCOSE 104* 11/25/2014 0514   BUN 65* 11/25/2014 0514   BUN 8.2 10/11/2014 1614   CREATININE 3.24* 11/25/2014 0514   CREATININE 0.8 10/11/2014 1614   CREATININE 0.79 02/06/2014 0908   CALCIUM 7.3* 11/25/2014 0514   PROT 5.7* 11/14/2014 1423   ALBUMIN 2.0* 12/04/2014 1423   AST 44* 11/08/2014 1423   ALT 33 11/11/2014 1423   ALKPHOS 251* 11/21/2014 1423   BILITOT 3.1* 12/05/2014 1423   GFRNONAA 14* 11/25/2014 0514   GFRNONAA 79 02/06/2014 0908   GFRAA 16* 11/25/2014 0514   GFRAA >89 02/06/2014 0908      Time In Time Out Total Time Spent with  Patient Total Overall Time  1000 1115 70 min 75 min    Greater than 50%  of this time was spent counseling and coordinating care related to the above assessment and plan. PMT will f/u in the morning  Wadie Lessen NP  Palliative Medicine Team Team Phone # [phone] Pager [phone]  Discussed with Dr Maryland Pink

## 2014-11-25 NOTE — Progress Notes (Signed)
[facility] PROGRESS NOTE  Kristina Lawson DJM:[id number] DOB: 16-Jul-1946 DOA: 11/22/2014  PCP: [name], MD  Brief HPI: 69 year old African-American female with metastatic lung cancer, brought in for failure to thrive and poor oral intake. Found to be in acute renal failure. She is also getting radiation treatment for brain metastases. CT scan from December showed marked progression of disease. Oncologist had recommended hospice care at home. She was admitted for further management  Past medical history:  Past Medical History  Diagnosis Date  . Hypertension   . Smoker   . Hyperlipidemia   . Brain cancer 06/17/14    multiple brain metastses  . 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 Seizures 06/17/14    witness at home brought to the ED  . S/P radiation therapy 07/16/14-07/31/14/palliative    brain,left base&infratemporal .chest/meiastinum,proxiaml rt humerus    Consultants: Palliative medicine   Procedures: None  Antibiotics: Ciprofloxacin 1/17  Subjective: Patient is moaning. She has her eyes open but does not answer my questions. She looks visibly in pain.  Objective: Vital Signs  Filed Vitals:   11/30/2014 2024 11/26/2014 2224 11/25/14 0010 11/25/14 0352  BP: 93/57  118/73 111/67  Pulse: 107  108 105  Temp: 98.5 F (36.9 C)  97.3 F (36.3 C) 98.5 F (36.9 C)  TempSrc: Axillary  Axillary Axillary  Resp: 28 18 20 24   SpO2: 100%  96% 100%    Intake/Output Summary (Last 24 hours) at 11/25/14 2229 Last data filed at 11/25/14 7989  Gross per 24 hour  Intake   1500 ml  Output      0 ml  Net   1500 ml   There were no vitals filed for this visit.   General appearance: She is awake. She is moaning. Appears to be in a lot of discomfort. Resp: Decreased air entry bilateral bases. No definite crackles or wheezing. Cardio: regular rate and rhythm, S1, S2 normal, no murmur, click, rub or gallop GI: Abdomen is slightly  distended. Tender diffusely without any rebound, rigidity or guarding. Nonspecific masses appreciated. Bowel sounds present. Extremities: Mild edema present bilateral lower extremities. Neurologic: Awake. Moaning. Moving all her extremities.  Lab Results:  Basic Metabolic Panel:  Recent Labs Lab 11/29/2014 1423 11/25/14 0514  NA 140 143  K 5.3* 6.0*  CL 107 109  CO2 20 16*  GLUCOSE 129* 104*  BUN 61* 65*  CREATININE 3.18* 3.24*  CALCIUM 8.1* 7.3*  MG 2.3  --   PHOS 4.8*  --    Liver Function Tests:  Recent Labs Lab 12/06/2014 1423  AST 44*  ALT 33  ALKPHOS 251*  BILITOT 3.1*  PROT 5.7*  ALBUMIN 2.0*   CBC:  Recent Labs Lab 11/10/2014 1423 11/25/14 0750  WBC 21.9* 15.2*  HGB 10.8* 10.0*  HCT 33.9* 31.4*  MCV 90.6 90.2  PLT 295 236   CBG:  Recent Labs Lab 12/05/2014 1413  GLUCAP 100*    Recent Results (from the past 240 hour(s))  Clostridium Difficile by PCR     Status: None   Collection Time: 11/25/14  5:15 AM  Result Value Ref Range Status   C difficile by pcr NEGATIVE NEGATIVE Final    Comment: Performed at [facility]      Studies/Results: Dg Chest 2 View  12/04/2014   CLINICAL DATA:  Shortness of breath today. Altered mental status since Friday. Hypertension. History of lung cancer.  EXAM: CHEST  2 VIEW  COMPARISON:  11/12/2014  FINDINGS: Moderate to large right pleural effusion, slightly increased since prior study. Increasing right lung airspace disease. No confluent opacity on the left. Heart is borderline enlarged. Mediastinal contours are within normal limits.  Old scratch head deformity of the right humeral head related to old healed injury, stable.  IMPRESSION: Enlarging moderate to large right pleural effusion and worsening right lung airspace disease.   Electronically Signed   By: Rolm Baptise M.D.   On: 11/25/2014 15:13   Ct Head Wo Contrast  11/23/2014   CLINICAL DATA:  Increase in AMS over past 3 days. History of brain mets. Nurse  note: pt has had AMS since early Friday AM. Loose stools and incont have developed. Pt has history of Lung Ca with Mets to brain.  EXAM: CT HEAD WITHOUT CONTRAST  TECHNIQUE: Contiguous axial images were obtained from the base of the skull through the vertex without intravenous contrast.  COMPARISON:  Head CT dated 06/17/2014 and brain MR dated 06/25/2014.  FINDINGS: Diffusely enlarged ventricles and subarachnoid spaces. Patchy white matter low density in both cerebral hemispheres. No intracranial hemorrhage, mass lesion or CT evidence of acute infarction. Unremarkable bones and included paranasal sinuses. The previously demonstrated white matter edema in the right parieto-occipital region is no longer seen. There is a small area of residual, more focal low density in that region in the white matter.  IMPRESSION: 1. No acute abnormality. 2. Resolved edema associated with a previously demonstrated metastatic lesion in the right occipital lobe. 3. The other metastases seen on MR are not visible on today's CT examination. 4. Progressive diffuse cerebral atrophy with interval chronic small vessel white matter ischemic or postradiation changes.   Electronically Signed   By: Enrique Sack M.D.   On: 12/02/2014 14:56    Medications:  Scheduled: . ciprofloxacin  400 mg Intravenous Q24H  . heparin  5,000 Units Subcutaneous 3 times per day  . sodium polystyrene  60 g Rectal Once   Continuous: . sodium chloride 150 mL/hr at 11/11/2014 2030   IOE:[id number] injection, ondansetron **OR** ondansetron (ZOFRAN) IV, oxyCODONE  Assessment/Plan:  Active Problems:   Brain metastasis   HTN (hypertension)   Lung cancer   Protein-calorie malnutrition, severe   Altered mental status   Acute kidney injury   Leukocytosis    Acute kidney injury with hyperkalemia This is most likely due to poor oral intake. She does have significant retroperitoneal lymphadenopathy, so obstruction cannot be ruled out. Continue with IV  fluids. Renal function is no better this morning. Potassium level is elevated. We will give her Kayexalate per rectally.  Metastatic lung cancer with brain metastases CT scan of the abdomen done in December showed marked progression of her cancer. She also has biliary obstruction status post stent placement in December. She also has a duodenal partial obstruction. Her oncologist had offered her hospice when he saw her last. She is not a candidate for further chemotherapy. Patient prognosis is extremely poor at this time. She appears to be actively dying. CT head did not show the brain metastases seen on previous MRI.  Questionable UTI Continue Cipro for now. Follow-up with urine culture  Essential HTN (hypertension) Continue to hold her blood pressure medicines.  Protein-calorie malnutrition, severe Per nutritionist.  Altered mental status Most likely secondary to dehydration and metastatic lung cancer and brain metastases.  Discussed with the patient's sister and son who are both at the bedside. Patient's sister was somewhat confrontational. Patient was initially  made DNR yesterday, but this was changed back to full code overnight. I explained to them that the patient was dying. And that we should allow her to pass away peacefully and comfortably. To some extent they do realize that there is nothing more to be done, but at the same time, they want aggressive measures in case her heart stops beating. There seems to be a disconnect. When I tried to probe further they pushed back. They did not want to have further conversation with me. I have discussed with Kristina Lawson with palliative care. She will come and speak with the patient's family.   Patient's oncologist has been flagged on Epic.   DVT Prophylaxis: Heparin    Code Status: Full code as of now  Family Communication: Discussed with the patient's son and sister  Disposition Plan: Not ready for discharge    LOS: 1 day    [facility] Pager [phone] 11/25/2014, 9:27 AM  If 7PM-7AM, please contact night-coverage at [email or web address], password [facility]

## 2014-11-25 NOTE — Progress Notes (Signed)
Nutrition Brief Note  Patient identified as Low Braden Score and positive Malnutrition Screening Tool Chart reviewed. Per MD note, pt actively dying and with extremely poor prognosis. Palliative care consult pending.   No nutrition interventions warranted at this time.  Please re-consult as needed.   Atlee Abide MS RD LDN Clinical Dietitian [phone]

## 2014-11-25 NOTE — Progress Notes (Signed)
Events noted. Patient known to me with end stage lung cancer admitted with changes in mental status. She is not very responsive at this time and appear agitated.  She appears to be actively dying and I agree with palliative medicine evaluation and assisting in end of life care.  This was discussed with her son and family.

## 2014-11-26 ENCOUNTER — Ambulatory Visit: Admission: RE | Admit: 2014-11-26 | Payer: Medicare PPO | Source: Ambulatory Visit

## 2014-11-26 DIAGNOSIS — N390 Urinary tract infection, site not specified: Secondary | ICD-10-CM

## 2014-11-26 DIAGNOSIS — R4182 Altered mental status, unspecified: Secondary | ICD-10-CM

## 2014-11-26 LAB — URINE CULTURE
COLONY COUNT: NO GROWTH
Culture: NO GROWTH

## 2014-11-26 MED ORDER — LORAZEPAM 2 MG/ML IJ SOLN: 0.5000 mg | Freq: Four times a day (QID) | INTRAMUSCULAR | Status: DC | PRN

## 2014-11-26 MED ORDER — SCOPOLAMINE 1 MG/3DAYS TD PT72
1.0000 | MEDICATED_PATCH | TRANSDERMAL | Status: DC
  Administered 2014-11-27: 1.5 mg via TRANSDERMAL
  Filled 2014-11-26: qty 1

## 2014-11-26 NOTE — Progress Notes (Signed)
Pt moaning and groaning; Son [name] and Jason's father at bedside; Jason's father requests pt have pain medicine/Morphine; [name] adamantly refuses for pt to receive pain medicine/Morphine, stating he wants pt to be alert; this nurse provided education re: s/s pain related to dx and end of life concerns; emotional support provided; [name] said he might consider it, but still strongly refuses at this time; will re-assess and continue to monitor... Blair Hailey, RN

## 2014-11-26 NOTE — Clinical Documentation Improvement (Signed)
Supporting Information:  Labs: 01/17:  Lactic acid: 3.60.   Possible Clinical Conditions: . Bacteremia (positive blood cultures only)  . Specify sepsis with UTI, versus UTI only . Sepsis-specify causative organism if known . Sepsis due to: --Device --Implant --Graft . Severe sepsis-sepsis with organ dysfunction --Specify organ dysfunction Respiratory failure Encephalopathy Acute kidney failure Other (specify) . SIRS (Systemic Inflammatory Response Syndrome --With or without organ dysfunction . Document septic shock if present . Document any associated diagnoses/conditions    Thank Sherian Maroon Documentation Specialist [phone] [email or web address]

## 2014-11-26 NOTE — Progress Notes (Signed)
[facility] PROGRESS NOTE  Kristina Lawson OIZ:[id number] DOB: September 26, 1946 DOA: 12/05/2014  PCP: [name], MD  Brief HPI: 69 year old African-American female with metastatic lung cancer, brought in for failure to thrive and poor oral intake. Found to be in acute renal failure. She is also getting radiation treatment for brain metastases. CT scan from December showed marked progression of disease. Oncologist had recommended hospice care at home. She was admitted for further management. Seen by palliative medicine. Changed to DO NOT RESUSCITATE.  Past medical history:  Past Medical History  Diagnosis Date  . Hypertension   . Smoker   . Hyperlipidemia   . Brain cancer 06/17/14    multiple brain metastses  . 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 Seizures 06/17/14    witness at home brought to the ED  . S/P radiation therapy 07/16/14-07/31/14/palliative    brain,left base&infratemporal .chest/meiastinum,proxiaml rt humerus    Consultants: Palliative medicine   Procedures: None  Antibiotics: Ciprofloxacin 1/17  Subjective: Patient is moaning. Eyes open. Does not answer questions.   Objective: Vital Signs  Filed Vitals:   11/25/14 1100 11/25/14 1300 11/25/14 2151 11/26/14 0600  BP: 109/72 125/79 116/70 138/64  Pulse: 109 113 111 113  Temp: 98.3 F (36.8 C) 98.2 F (36.8 C) 98.2 F (36.8 C) 98.2 F (36.8 C)  TempSrc: Axillary Axillary Axillary Axillary  Resp:  16 16 16   Height:   5\' 2"  (1.575 m)   Weight:    75 kg (165 lb 5.5 oz)  SpO2: 95% 97% 93% 99%    Intake/Output Summary (Last 24 hours) at 11/26/14 0928 Last data filed at 11/26/14 0600  Gross per 24 hour  Intake 2768.75 ml  Output     30 ml  Net 2738.75 ml   Filed Weights   11/26/14 0600  Weight: 75 kg (165 lb 5.5 oz)     General appearance: She is awake. She is moaning.  Resp: Decreased air entry bilateral bases. No definite crackles or  wheezing. Cardio: regular rate and rhythm, S1, S2 normal, no murmur, click, rub or gallop GI: Abdomen is distended. Tender diffusely without any rebound, rigidity or guarding. Nonspecific masses appreciated. Bowel sounds present. Extremities: Mild edema present bilateral lower extremities. Neurologic: Awake. Moaning. Moving all her extremities.  Lab Results:  Basic Metabolic Panel:  Recent Labs Lab 12/03/2014 1423 11/25/14 0514  NA 140 143  K 5.3* 6.0*  CL 107 109  CO2 20 16*  GLUCOSE 129* 104*  BUN 61* 65*  CREATININE 3.18* 3.24*  CALCIUM 8.1* 7.3*  MG 2.3  --   PHOS 4.8*  --    Liver Function Tests:  Recent Labs Lab 11/22/2014 1423  AST 44*  ALT 33  ALKPHOS 251*  BILITOT 3.1*  PROT 5.7*  ALBUMIN 2.0*   CBC:  Recent Labs Lab 12/04/2014 1423 11/25/14 0750  WBC 21.9* 15.2*  HGB 10.8* 10.0*  HCT 33.9* 31.4*  MCV 90.6 90.2  PLT 295 236   CBG:  Recent Labs Lab 11/21/2014 1413  GLUCAP 100*    Recent Results (from the past 240 hour(s))  Culture, Urine     Status: None   Collection Time: 11/19/2014  4:19 PM  Result Value Ref Range Status   Specimen Description URINE, CATHETERIZED  Final   Special Requests NONE  Final   Colony Count NO GROWTH Performed at [facility]   Final   Culture NO GROWTH Performed at [facility]   Final   Report Status 11/26/2014 FINAL  Final  Clostridium Difficile by PCR     Status: None   Collection Time: 11/25/14  5:15 AM  Result Value Ref Range Status   C difficile by pcr NEGATIVE NEGATIVE Final    Comment: Performed at [facility]      Studies/Results: Dg Chest 2 View  11/21/2014   CLINICAL DATA:  Shortness of breath today. Altered mental status since Friday. Hypertension. History of lung cancer.  EXAM: CHEST  2 VIEW  COMPARISON:  11/12/2014  FINDINGS: Moderate to large right pleural effusion, slightly increased since prior study. Increasing right lung airspace disease. No confluent opacity on the  left. Heart is borderline enlarged. Mediastinal contours are within normal limits.  Old scratch head deformity of the right humeral head related to old healed injury, stable.  IMPRESSION: Enlarging moderate to large right pleural effusion and worsening right lung airspace disease.   Electronically Signed   By: Rolm Baptise M.D.   On: 11/20/2014 15:13   Ct Head Wo Contrast  11/28/2014   CLINICAL DATA:  Increase in AMS over past 3 days. History of brain mets. Nurse note: pt has had AMS since early Friday AM. Loose stools and incont have developed. Pt has history of Lung Ca with Mets to brain.  EXAM: CT HEAD WITHOUT CONTRAST  TECHNIQUE: Contiguous axial images were obtained from the base of the skull through the vertex without intravenous contrast.  COMPARISON:  Head CT dated 06/17/2014 and brain MR dated 06/25/2014.  FINDINGS: Diffusely enlarged ventricles and subarachnoid spaces. Patchy white matter low density in both cerebral hemispheres. No intracranial hemorrhage, mass lesion or CT evidence of acute infarction. Unremarkable bones and included paranasal sinuses. The previously demonstrated white matter edema in the right parieto-occipital region is no longer seen. There is a small area of residual, more focal low density in that region in the white matter.  IMPRESSION: 1. No acute abnormality. 2. Resolved edema associated with a previously demonstrated metastatic lesion in the right occipital lobe. 3. The other metastases seen on MR are not visible on today's CT examination. 4. Progressive diffuse cerebral atrophy with interval chronic small vessel white matter ischemic or postradiation changes.   Electronically Signed   By: Enrique Sack M.D.   On: 11/26/2014 14:56    Medications:  Scheduled: . ciprofloxacin  400 mg Intravenous Q24H   Continuous: . sodium chloride 75 mL/hr at 11/25/14 1615   RKY:HCWCBJSE injection, ondansetron **OR** ondansetron (ZOFRAN) IV, oxyCODONE  Assessment/Plan:  Active  Problems:   Brain metastasis   HTN (hypertension)   Lung cancer   Protein-calorie malnutrition, severe   Altered mental status   Acute kidney injury   Leukocytosis   DNR (do not resuscitate) discussion   Dyspnea   Palliative care encounter    Acute kidney injury with hyperkalemia This is most likely due to poor oral intake. She does have significant retroperitoneal lymphadenopathy, so obstruction cannot be ruled out. Continue with IV fluids, mainly for comfort. No further blood work.   Terminal Metastatic lung cancer with brain metastases CT scan of the abdomen done in December showed marked progression of her cancer. She also has biliary obstruction status post stent placement in December. She also has a duodenal partial obstruction. Her oncologist had offered her hospice when he saw her last. She is not a candidate for further chemotherapy. Patient prognosis is extremely poor at this time. She appears to be actively dying. CT  head did not show the brain metastases seen on previous MRI.  Questionable UTI with Sepsis Continue Cipro for now. Follow-up with urine culture. No aggressive measures.  Essential HTN (hypertension) Continue to hold her blood pressure medicines.  Protein-calorie malnutrition, severe Unable to take orally. Poor prognosis.  Altered mental status Most likely secondary to dehydration and metastatic lung cancer and brain metastases.  No family was at bedside today. Discussed with palliative medicine yesterday. They will have further discussions with the patient's family. She could benefit from narcotics for pain control. We will defer to them to address this with the family.  DVT Prophylaxis: None. Mostly comfort care. Code Status: DO NOT RESUSCITATE  Family Communication: No family at bedside today Disposition Plan: Unclear.     LOS: 2 days   [facility] Pager [phone] 11/26/2014, 9:28 AM  If 7PM-7AM, please contact  night-coverage at [email or web address], password [facility]

## 2014-11-27 ENCOUNTER — Ambulatory Visit: Payer: Medicare PPO

## 2014-11-28 ENCOUNTER — Ambulatory Visit: Payer: Medicare PPO

## 2014-11-29 ENCOUNTER — Ambulatory Visit: Payer: Medicare PPO

## 2014-11-29 ENCOUNTER — Ambulatory Visit: Payer: Medicare HMO | Admitting: Radiation Oncology

## 2014-12-02 ENCOUNTER — Ambulatory Visit: Payer: Medicare PPO

## 2014-12-02 ENCOUNTER — Other Ambulatory Visit: Payer: Medicare HMO

## 2014-12-03 ENCOUNTER — Ambulatory Visit: Payer: Medicare PPO

## 2014-12-04 ENCOUNTER — Ambulatory Visit: Payer: Medicare PPO

## 2014-12-04 ENCOUNTER — Ambulatory Visit: Payer: Medicare HMO | Admitting: Radiation Oncology

## 2014-12-05 ENCOUNTER — Ambulatory Visit: Payer: Medicare HMO | Admitting: Oncology

## 2014-12-05 ENCOUNTER — Other Ambulatory Visit: Payer: Medicare HMO

## 2014-12-09 NOTE — Progress Notes (Signed)
I visited with the patient and her son today. The patient recently had been proceeding with palliative radiation treatment before her performance status declined. She has a very grave prognosis currently - she was unresponsive to me today. Radiation treatment had previously been discontinued. Please let me know if I can be of any assistance.  ------------------------------------------------  Jodelle Gross, MD, PhD

## 2014-12-09 NOTE — Progress Notes (Signed)
Progress Note from the Palliative Medicine Team at [facility] and Plan:  -patient is actively dying, [name] (only son) at bedside  -continued emotional support offered, continued discussion regarding natural trajectory and expectataions at EOL  -education offered regarding management of symptoms at EOL, questions and concerns addressed  -Transitioning at EOL, prognosis is likley hrs   Objective: Allergies  Allergen Reactions  . Penicillins Other (See Comments)    unknown   Scheduled Meds: . ciprofloxacin  400 mg Intravenous Q24H  . scopolamine  1 patch Transdermal Q72H   Continuous Infusions: . sodium chloride 35 mL (11/26/14 0951)   PRN Meds:.LORazepam, morphine injection, ondansetron **OR** ondansetron (ZOFRAN) IV, oxyCODONE  BP 90/58 mmHg  Pulse 111  Temp(Src) 98.4 F (36.9 C) (Axillary)  Resp 20  Ht 5\' 2"  (1.575 m)  Wt 75 kg (165 lb 5.5 oz)  BMI 30.23 kg/m2  SpO2 97%   PPS:10 %    Intake/Output Summary (Last 24 hours) at 11-29-14 1434 Last data filed at 2014-11-29 0631  Gross per 24 hour  Intake      0 ml  Output     20 ml  Net    -20 ml        Physical Exam:  General: Unresponsive to gentle touch and verbal stimuli HEENT:  Dry buccal membranes Chest:   Decreased in bases CVS: tachycardic   Labs: CBC    Component Value Date/Time   WBC 15.2* 11/25/2014 0750   RBC 3.48* 11/25/2014 0750   HGB 10.0* 11/25/2014 0750   HCT 31.4* 11/25/2014 0750   PLT 236 11/25/2014 0750   MCV 90.2 11/25/2014 0750   MCH 28.7 11/25/2014 0750   MCHC 31.8 11/25/2014 0750   RDW 19.1* 11/25/2014 0750   LYMPHSABS 0.6* 10/25/2014 1515   MONOABS 1.1* 10/25/2014 1515   EOSABS 0.1 10/25/2014 1515   BASOSABS 0.0 10/25/2014 1515    BMET    Component Value Date/Time   NA 143 11/25/2014 0514   K 6.0* 11/25/2014 0514   CL 109 11/25/2014 0514   CO2 16* 11/25/2014 0514   GLUCOSE 104* 11/25/2014 0514   BUN 65* 11/25/2014 0514   BUN 8.2 10/11/2014 1614   CREATININE 3.24* 11/25/2014 0514   CREATININE 0.8 10/11/2014 1614   CREATININE 0.79 02/06/2014 0908   CALCIUM 7.3* 11/25/2014 0514   GFRNONAA 14* 11/25/2014 0514   GFRNONAA 79 02/06/2014 0908   GFRAA 16* 11/25/2014 0514   GFRAA >89 02/06/2014 0908    CMP     Component Value Date/Time   NA 143 11/25/2014 0514   K 6.0* 11/25/2014 0514   CL 109 11/25/2014 0514   CO2 16* 11/25/2014 0514   GLUCOSE 104* 11/25/2014 0514   BUN 65* 11/25/2014 0514   BUN 8.2 10/11/2014 1614   CREATININE 3.24* 11/25/2014 0514   CREATININE 0.8 10/11/2014 1614   CREATININE 0.79 02/06/2014 0908   CALCIUM 7.3* 11/25/2014 0514   PROT 5.7* 11/23/2014 1423   ALBUMIN 2.0* 11/19/2014 1423   AST 44* 11/09/2014 1423   ALT 33 12/02/2014 1423   ALKPHOS 251* 11/11/2014 1423   BILITOT 3.1* 11/15/2014 1423   GFRNONAA 14* 11/25/2014 0514   GFRNONAA 79 02/06/2014 0908   GFRAA 16* 11/25/2014 0514   GFRAA >89 02/06/2014 0908    Time In Time Out Total Time Spent with Patient Total Overall Time  1400 1425 25 min 25 min    Greater than 50%  of this time was spent counseling and coordinating care  related to the above assessment and plan.  Wadie Lessen NP  Palliative Medicine Team Team Phone # [phone] Pager [phone]   1

## 2014-12-09 NOTE — Progress Notes (Signed)
Pt moaning and increased respirations.  Attempted to administer 1mg  morphine IV as ordered, pt son denied and would not allow medication administration.

## 2014-12-09 NOTE — Progress Notes (Signed)
[facility] PROGRESS NOTE  Kristina Lawson MBT:[id number] DOB: [birth year] DOA: 12/01/2014 PCP: [name], MD  Brief narrative 69 year old female with metastatic lung cancer receiving radiation for brain metastases with marked progression of the disease admitted for failure to thrive and poor by mouth intake. Patient found to be in acute renal failure. Oncology recommends hospice due to poor prognosis. Patient admitted for further management and palliative care following.  Assessment/Plan: Acute kidney injury with hyperkalemia Likely secondary to poor by mouth intake. Also has significant retroperitoneal lymphadenopathy which could contribute to some obstruction. On IV fluids. Colace for comfort. Will hold further blood draws.  Metastatic lung cancer CT scan of the abdomen one month back showing marked progression of the disease and brain metastases. She also had biliary obstruction requiring stent placement in December 2015 and partial duodenal obstruction. Her oncologist recommends hospice and no further therapy. Patient has poor prognosis, is poorly responsive. Palliative care team following. Son is the healthcare power of attorney. He has been difficulty allowing her receive frequent morphine for discomfort and dyspnea. I had a long conversation with him this morning and he agrees her to receive scheduled IV morphine and not start morphine drip at this time. -Supportive care with oxygen and when necessary Ativan. -Continue Foley for comfort. Prognosis is extremely guarded.  Sepsis secondary to? UTI Continue IV Cipro. No further aggressive measures  Hypertension  Hold blood pressure medications  Severe protein calorie malnutrition Patient unable to eat or drink anything at this time  Acute encephalopathy Secondary to brain metastases and severe dehydration with failure to thrive    Code Status: DO NOT RESUSCITATE Family Communication: Son at bedside Disposition  Plan: Discussed possible residential hospice with the son depending upon her progress in the next 24 hours.   Consultants:  Oncology  Palliative care  Procedures:  None  Antibiotics:  ciprofloxacin  HPI/Subjective: Patient seen and examined. She is nonverbal and morning with occasional eye movements. Son at bedside  Objective: Filed Vitals:   12-10-2014 0615  BP: 107/62  Pulse: 113  Temp: 97.9 F (36.6 C)  Resp: 18    Intake/Output Summary (Last 24 hours) at 2014/12/10 1313 Last data filed at 2014-12-10 0631  Gross per 24 hour  Intake      0 ml  Output     20 ml  Net    -20 ml   Filed Weights   11/26/14 0600  Weight: 75 kg (165 lb 5.5 oz)    Exam:   General: Elderly female lying in bed nonverbal with eye opening, morning  HEENT: Dry oral mucosa  Cardiovascular: S1 and S2 tachycardic  Respiratory: Shallow respiration, Diminished bibasilar breath sounds  Abdomen: , Nondistended, nontender, foley  in place  Musculoskeletal: Warm, no edema  CNS: Awake but poorly responsive with moaning   Data Reviewed: Basic Metabolic Panel:  Recent Labs Lab 11/11/2014 1423 11/25/14 0514  NA 140 143  K 5.3* 6.0*  CL 107 109  CO2 20 16*  GLUCOSE 129* 104*  BUN 61* 65*  CREATININE 3.18* 3.24*  CALCIUM 8.1* 7.3*  MG 2.3  --   PHOS 4.8*  --    Liver Function Tests:  Recent Labs Lab 11/15/2014 1423  AST 44*  ALT 33  ALKPHOS 251*  BILITOT 3.1*  PROT 5.7*  ALBUMIN 2.0*   No results for input(s): LIPASE, AMYLASE in the last 168 hours. No results for input(s): AMMONIA in the last 168 hours. CBC:  Recent Labs Lab 12/05/2014 1423 11/25/14 0750  WBC 21.9* 15.2*  HGB 10.8* 10.0*  HCT 33.9* 31.4*  MCV 90.6 90.2  PLT 295 236   Cardiac Enzymes: No results for input(s): CKTOTAL, CKMB, CKMBINDEX, TROPONINI in the last 168 hours. BNP (last 3 results) No results for input(s): PROBNP in the last 8760 hours. CBG:  Recent Labs Lab 12/05/2014 1413  GLUCAP 100*     Recent Results (from the past 240 hour(s))  Culture, Urine     Status: None   Collection Time: 12/01/2014  4:19 PM  Result Value Ref Range Status   Specimen Description URINE, CATHETERIZED  Final   Special Requests NONE  Final   Colony Count NO GROWTH Performed at [facility]   Final   Culture NO GROWTH Performed at [facility]   Final   Report Status 11/26/2014 FINAL  Final  Clostridium Difficile by PCR     Status: None   Collection Time: 11/25/14  5:15 AM  Result Value Ref Range Status   C difficile by pcr NEGATIVE NEGATIVE Final    Comment: Performed at [facility]     Studies: No results found.  Scheduled Meds: . ciprofloxacin  400 mg Intravenous Q24H  . scopolamine  1 patch Transdermal Q72H   Continuous Infusions: . sodium chloride 35 mL (11/26/14 0951)      Time spent: 25 minutes    Nohlan Burdin, [place]  [facility] Pager [phone]. If 7PM-7AM, please contact night-coverage at [email or web address], password [facility] 04-Dec-2014, 1:13 PM  LOS: 3 days

## 2014-12-09 NOTE — Plan of Care (Signed)
Problem: Phase I Progression Outcomes Goal: Pain controlled with appropriate interventions Outcome: Not Met (add Reason) Pt family refusing pain medication. Goal: Voiding-avoid urinary catheter unless indicated Outcome: Completed/Met Date Met:  12/14/2014 Foley catheter needed for end of life care Goal: Hemodynamically stable Outcome: Not Applicable Date Met:  08/24/50 Comfort care

## 2014-12-09 DEATH — deceased

## 2014-12-16 NOTE — Progress Notes (Signed)
  Radiation Oncology         [phone] ________________________________  Name: Kristina Lawson MRN: [id number]  Date: 11/18/2014  DOB: [birth year]  End of Treatment Note  Diagnosis:   Metastatic lung cancer     Indication for treatment:  palliaitve       Radiation treatment dates:   11/07/14 - 11/12/14  Site/dose:   The bilateral neck was to be treated to 30 Gy in 10 fractions. The patient was treated with ap and pa fields.  Narrative: The patient tolerated radiation treatment but her overall status declined significantly. The patient's treatment therefore was discontinued after 2 fractions, corresponding to 6 Gy.  Plan: No plans for follow-up. ________________________________  Jodelle Gross, M.D., Ph.D.

## 2014-12-23 ENCOUNTER — Encounter: Payer: Self-pay | Admitting: Radiation Oncology

## 2015-01-29 NOTE — Discharge Summary (Signed)
Physician Discharge Summary  Kristina Lawson QPR:[id number] DOB: Mar 11, 1946 DOA: 11/19/2014  PCP: [name], MD  Admit date: 12/05/2014 Discharge date: 01/29/2015  Time spent: 20 minutes    Discharge Diagnoses:  principle Problems:   Brain metastasis  Active problem   HTN (hypertension)   Lung cancer   Protein-calorie malnutrition, severe   Altered mental status   Acute kidney injury   Leukocytosis   DNR (do not resuscitate) discussion   Dyspnea   Palliative care encounter   Patient expired on December 01, 2014 at 11:10 pm  Filed Weights   11/26/14 0600 01-Dec-2014 2322  Weight: 75 kg (165 lb 5.5 oz) 75 kg (165 lb 5.5 oz)    History of present illness:  69 year old female with metastatic lung cancer receiving radiation for brain metastases with marked progression of the disease admitted for failure to thrive and poor by mouth intake. Patient found to be in acute renal failure.  Oncology recommends hospice due to poor prognosis. Patient admitted for further management and palliative care following.   Hospital Course:  Acute kidney injury with hyperkalemia Likely secondary to poor by mouth intake. Also had significant retroperitoneal lymphadenopathy which could contribute to some obstruction.   Metastatic lung cancer CT scan of the abdomen one month back showing marked progression of the disease and brain metastases. She also had biliary obstruction requiring stent placement in December 2015 and partial duodenal obstruction. Her oncologist recommended hospice and no further therapy. Given poor prognosis, palliative care and  hospitalists discussed poor prognosis with family and was made full comfort after family agreed.  Sepsis secondary to? UTI Given IV cipro    Severe protein calorie malnutrition    Acute encephalopathy Secondary to brain metastases and severe dehydration with failure to thrive  Patient showed continuous decline in function, became progressively  hypotensive. She was pronounced dead on 12/01/14 at 11:10 pm    Consultants:  Oncology  Palliative care  Procedures:  None  Antibiotics:  ciprofloxacin  Discharge Exam: Filed Vitals:   2014/12/01 2245  BP: 70/47  Pulse: 100  Temp: 98.7 F (37.1 C)  Resp: 22           Allergies  Allergen Reactions  . Penicillins Other (See Comments)    unknown      The results of significant diagnostics from this hospitalization (including imaging, microbiology, ancillary and laboratory) are listed below for reference.    Significant Diagnostic Studies: No results found.  Microbiology: No results found for this or any previous visit (from the past 240 hour(s)).       SignedLouellen Molder  [facility] 01/29/2015, 4:45 PM

## 2015-04-10 IMAGING — CR DG CHEST 1V
1 series · 1 of 1 positions shown · non-contrast
Comparison: 10/30/2014 and earlier.

CLINICAL DATA: 68-year-old female status post right thoracentesis.
Initial encounter. Current history of metastatic lung cancer,
malignant obstructed jaundice.

EXAM:
CHEST - 1 VIEW

[view not recorded]
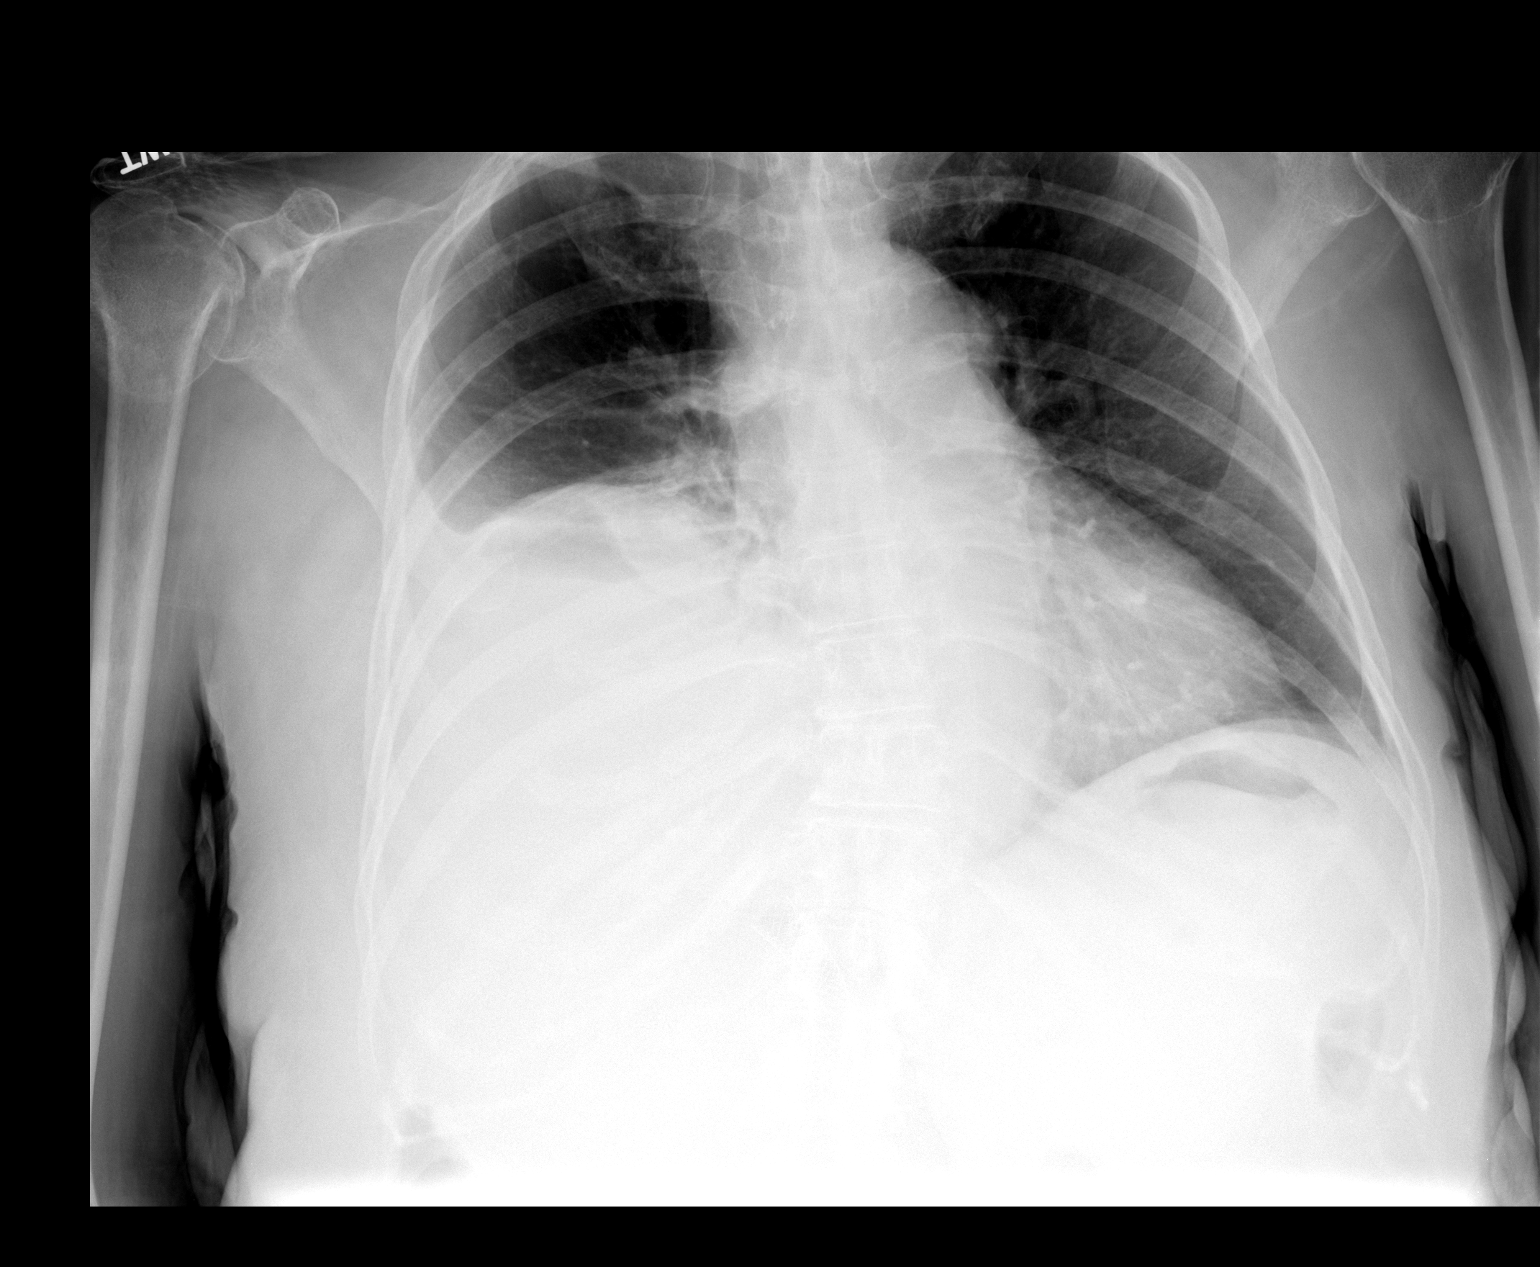

[1 of 1 positions shown; findings below may reference images not displayed]

FINDINGS: Portable AP upright view at 9890 hrs. Decreased right pleural
effusion volume and no pneumothorax. Mildly improved right lung
ventilation. Stable left lung. Stable cardiac size and mediastinal
contours. Metallic CBD region stent re- identified.
IMPRESSION: Decreased right pleural effusion and no pneumothorax following
thoracentesis.

## 2015-04-22 IMAGING — CT CT HEAD W/O CM
2 series · 15 of 30 positions shown, 19 images · non-contrast
Comparison: Head CT dated 06/17/2014 and brain MR dated 06/25/2014.

CLINICAL DATA: Increase in AMS over past 3 days. History of brain
mets.
Nurse note: pt has had AMS since early [REDACTED] AM. Loose stools and
incont have developed. Pt has history of Lung Ca with Mets to brain.

EXAM:
CT HEAD WITHOUT CONTRAST
TECHNIQUE: Contiguous axial images were obtained from the base of the skull
through the vertex without intravenous contrast.

[Series 2: head w/o · axial · non-contrast · 0.45mm/px · z∈[-150,-30]mm · 13 of 28 slices shown, 17 images]
[im 2/28  brain]
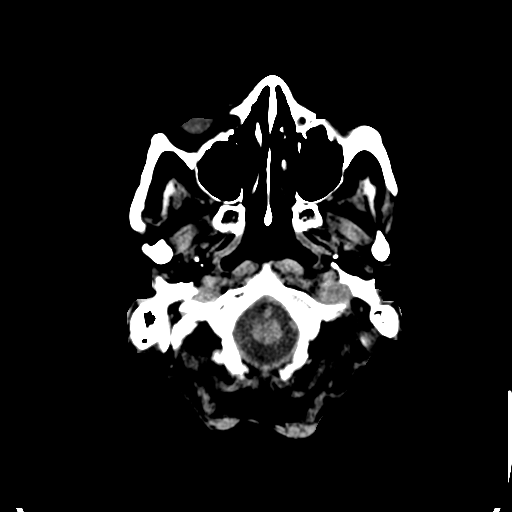
[im 2/28  bone]
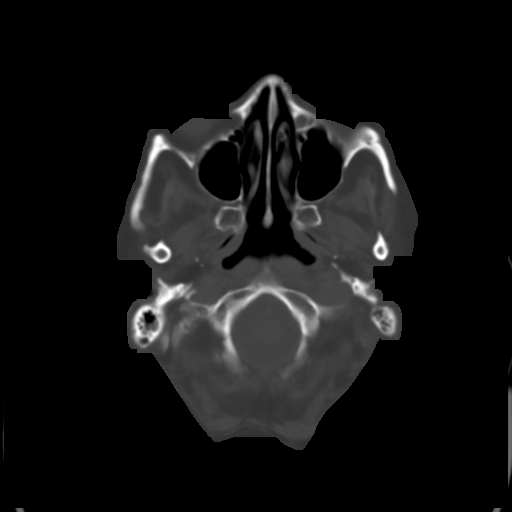
[im 4/28  brain]
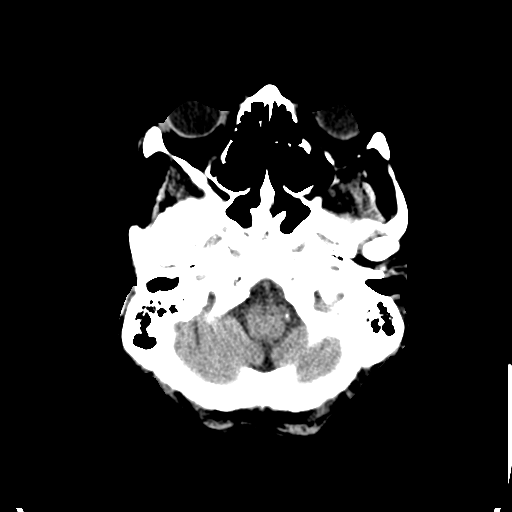
[im 6/28  brain]
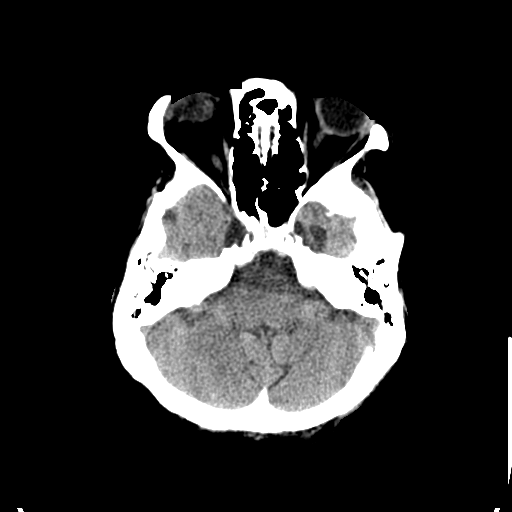
[im 8/28  brain]
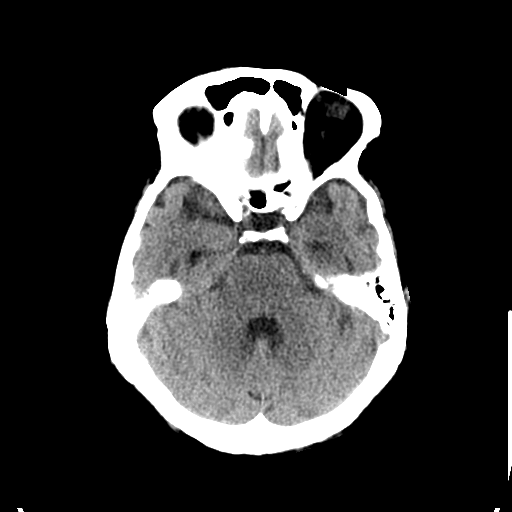
[im 10/28  brain]
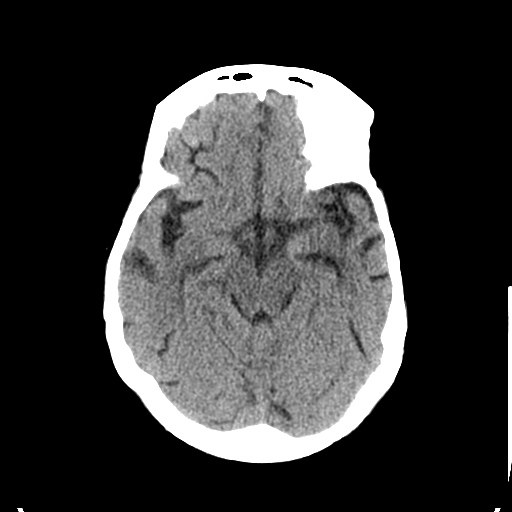
[im 10/28  bone]
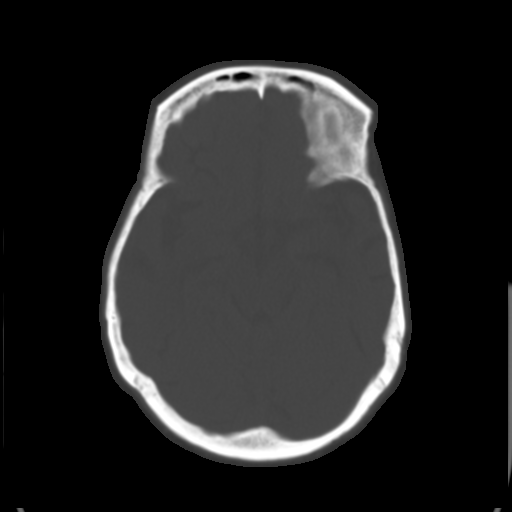
[im 12/28  brain]
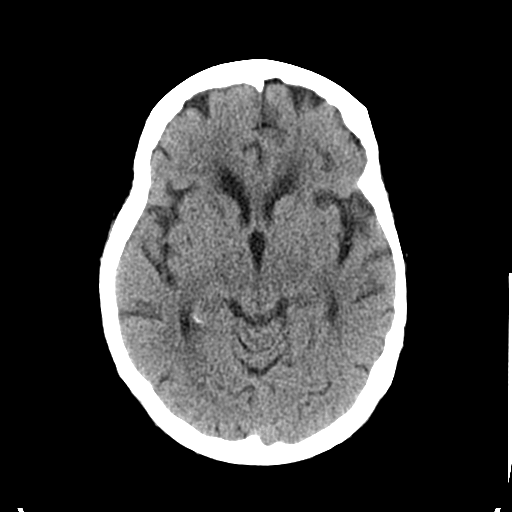
[im 14/28  brain]
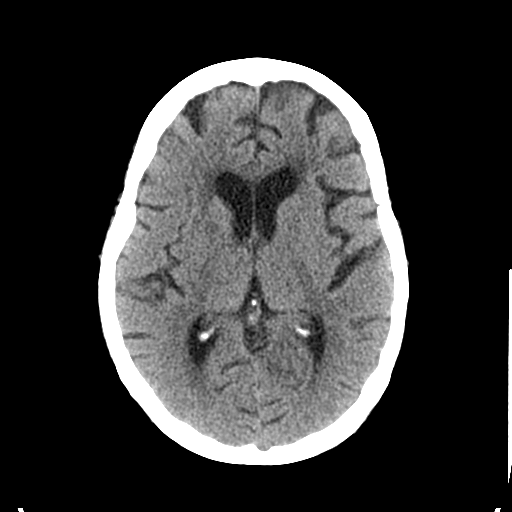
[im 16/28  brain]
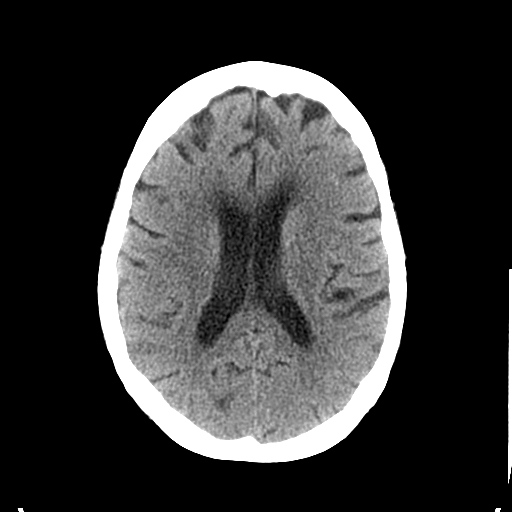
[im 18/28  brain]
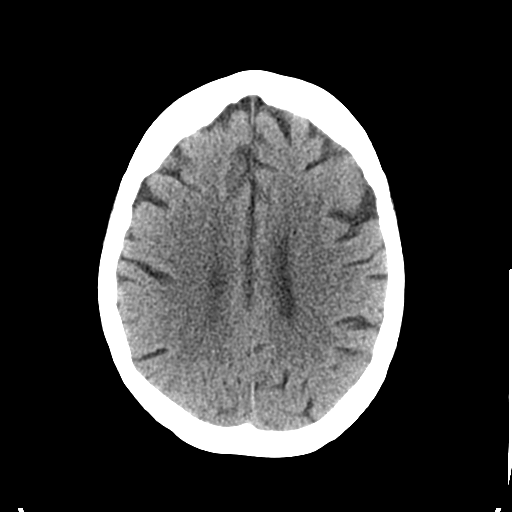
[im 18/28  bone]
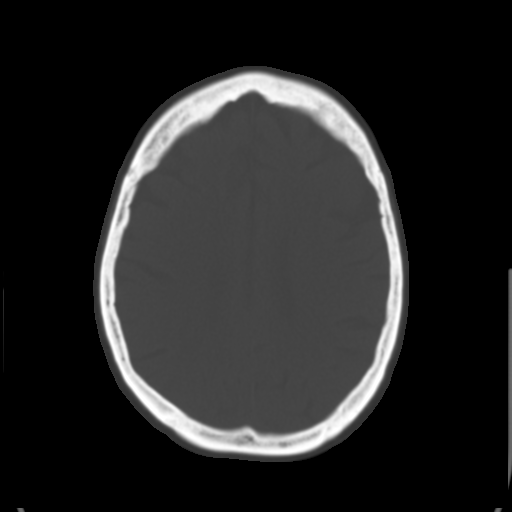
[im 20/28  brain]
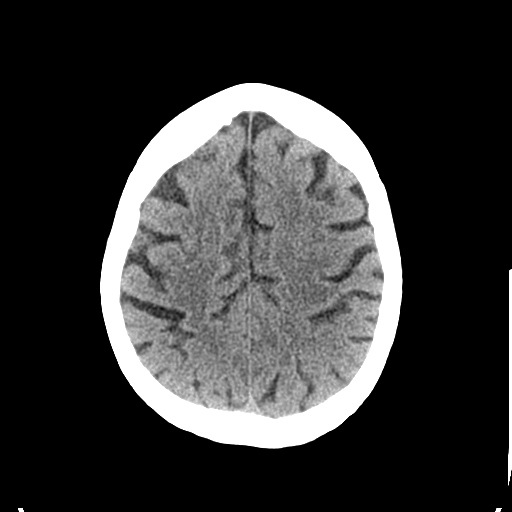
[im 22/28  brain]
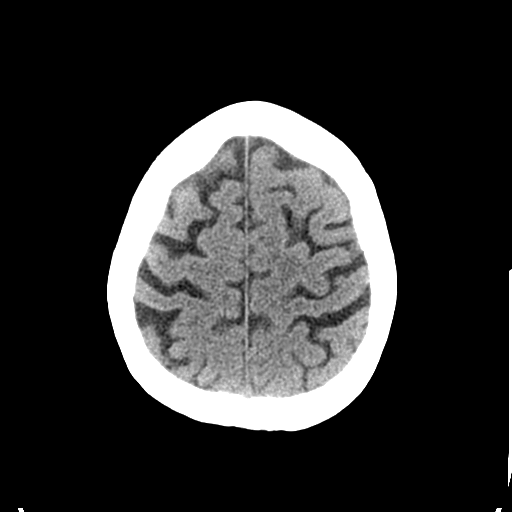
[im 24/28  brain]
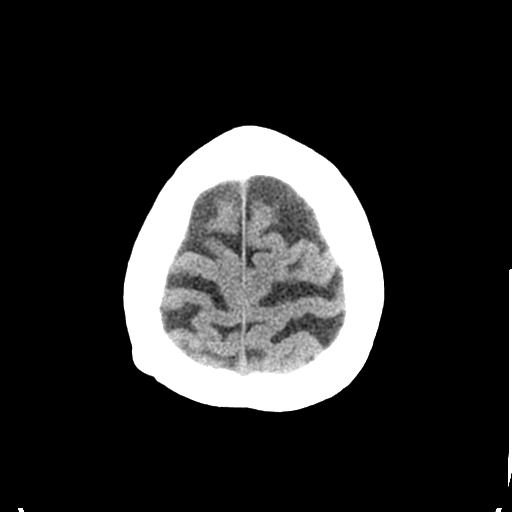
[im 26/28  brain]
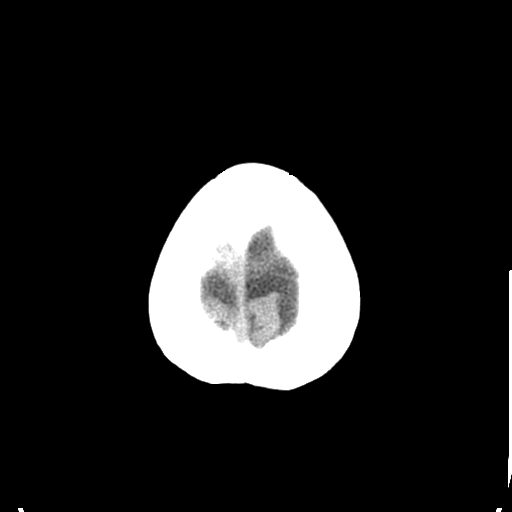
[im 26/28  bone]
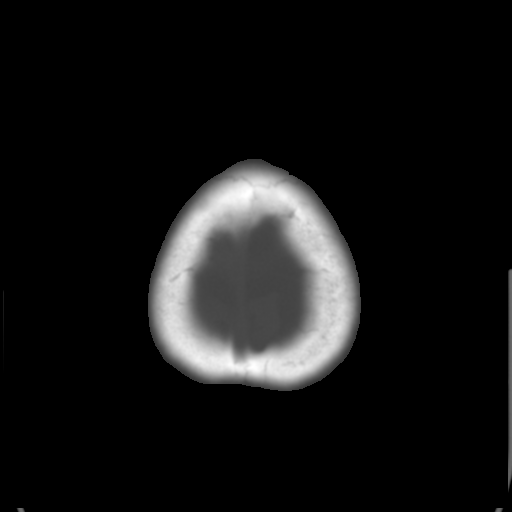

[Series 3: bone windows · axial · 0.45mm/px · z∈[-150,-130]mm · 2 of 28 slices shown]
[im 2/28  bone]
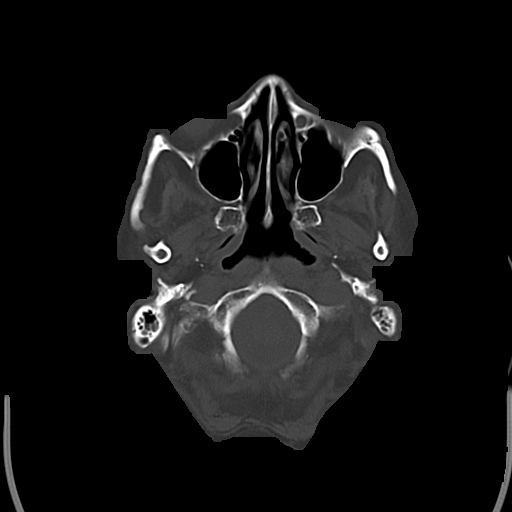
[im 6/28  bone]
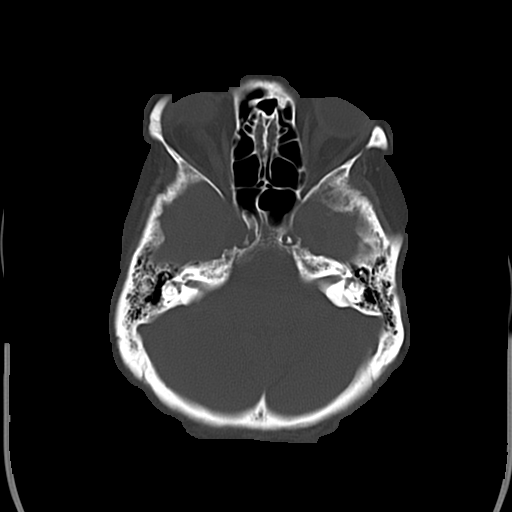

[15 of 30 positions shown; findings below may reference images not displayed]

FINDINGS: Diffusely enlarged ventricles and subarachnoid spaces. Patchy white
matter low density in both cerebral hemispheres. No intracranial
hemorrhage, mass lesion or CT evidence of acute infarction.
Unremarkable bones and included paranasal sinuses. The previously
demonstrated white matter edema in the right parieto-occipital
region is no longer seen. There is a small area of residual, more
focal low density in that region in the white matter.
IMPRESSION: 1. No acute abnormality.
2. Resolved edema associated with a previously demonstrated
metastatic lesion in the right occipital lobe.
3. The other metastases seen on MR are not visible on today's CT
examination.
4. Progressive diffuse cerebral atrophy with interval chronic small
vessel white matter ischemic or postradiation changes.

## 2015-04-22 IMAGING — CR DG CHEST 2V
2 series · 2 of 2 positions shown · non-contrast
Comparison: 11/12/2014

CLINICAL DATA: Shortness of breath today. Altered mental status
since [REDACTED]. Hypertension. History of lung cancer.

EXAM:
CHEST  2 VIEW

[w chest lat]
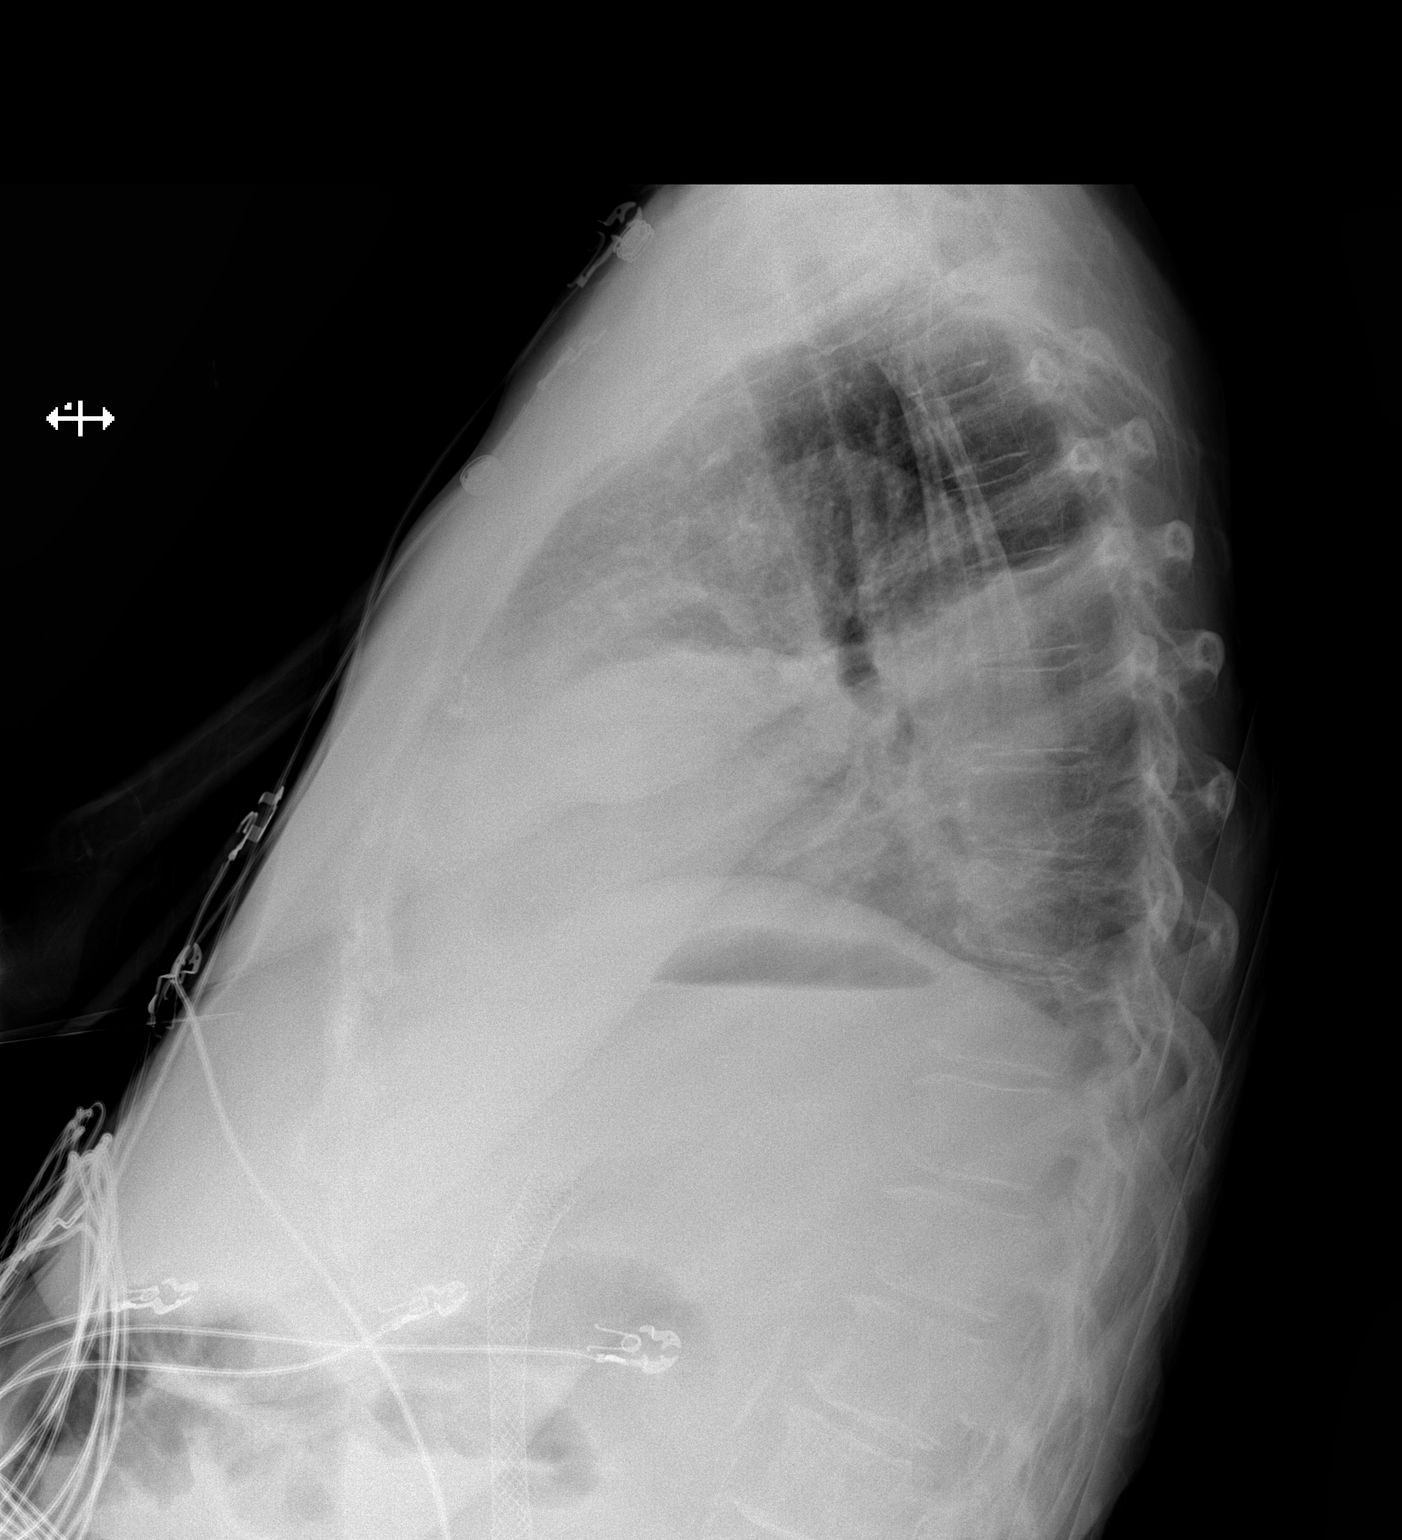

[x chest ap]
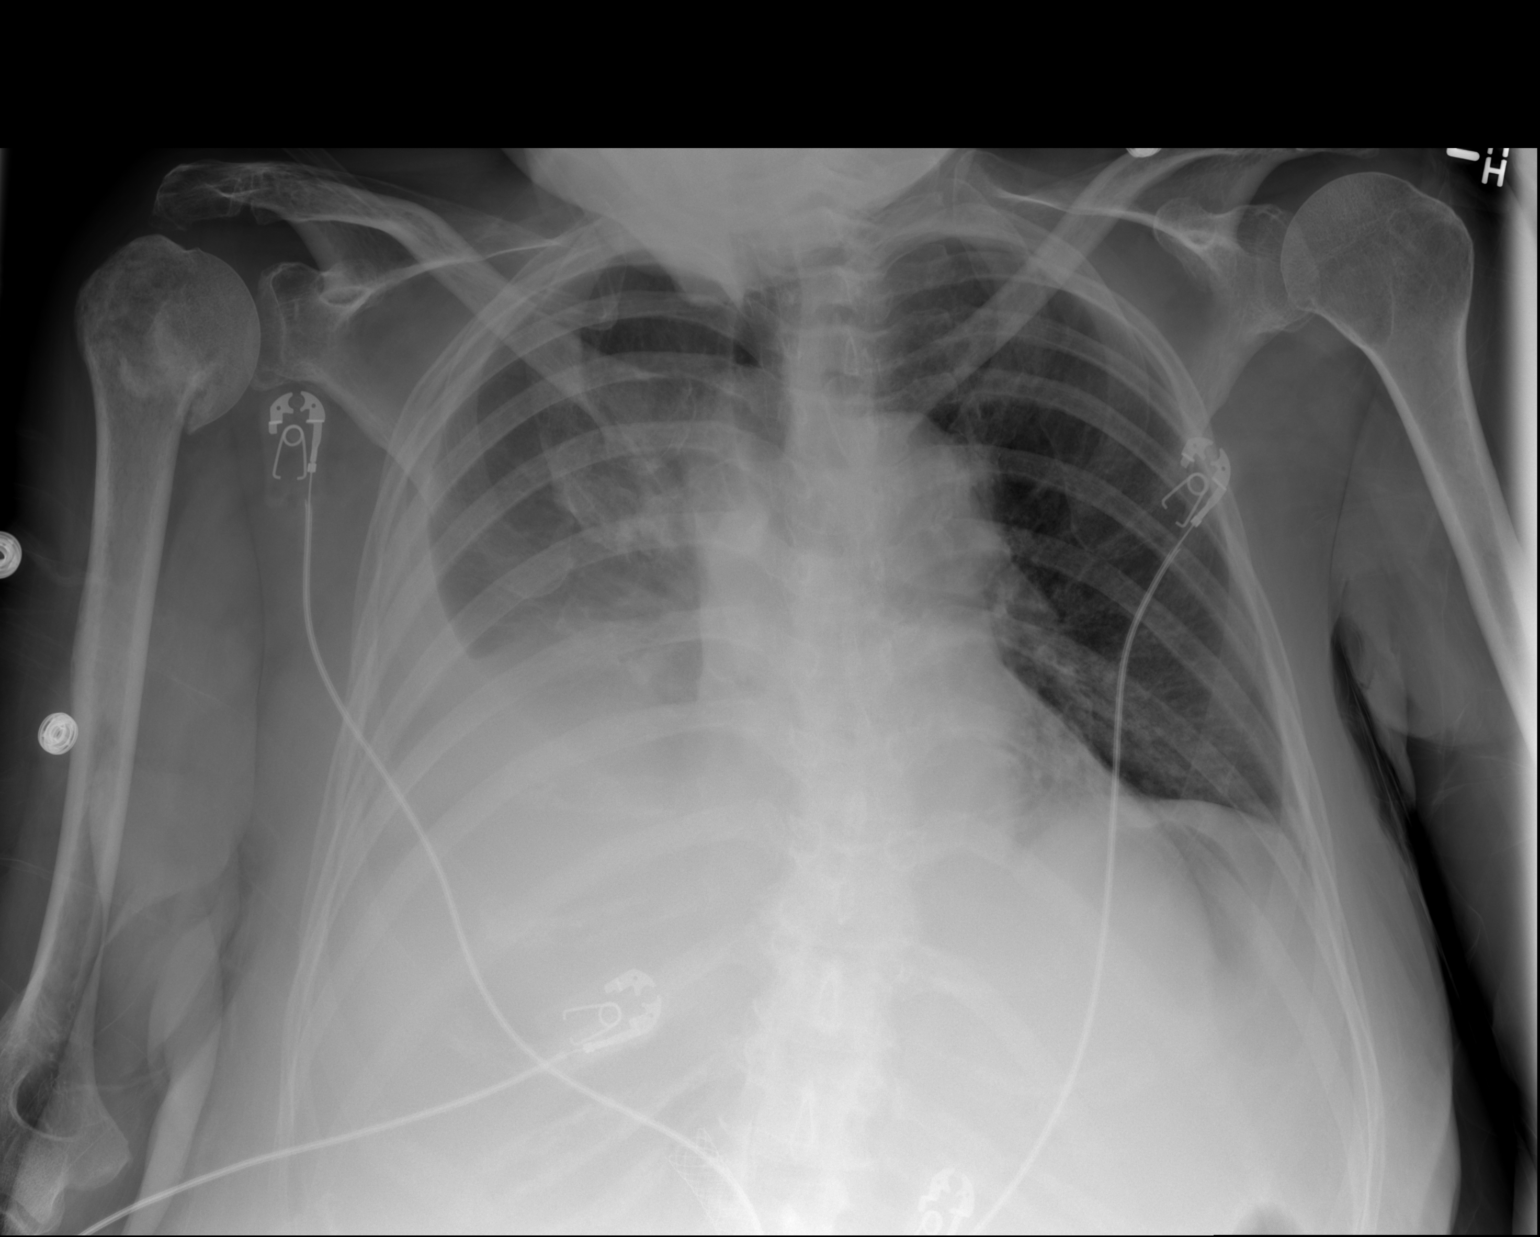

[2 of 2 positions shown; findings below may reference images not displayed]

FINDINGS: Moderate to large right pleural effusion, slightly increased since
prior study. Increasing right lung airspace disease. No confluent
opacity on the left. Heart is borderline enlarged. Mediastinal
contours are within normal limits.

Old scratch head deformity of the right humeral head related to old
healed injury, stable.
IMPRESSION: Enlarging moderate to large right pleural effusion and worsening
right lung airspace disease.
